# Patient Record
Sex: Female | Born: 1970
Health system: Southern US, Community
[De-identification: ages and names within clinical notes are randomized; demographics above are authoritative.]

## PROBLEM LIST (undated history)

## (undated) DIAGNOSIS — I10 Essential (primary) hypertension: Secondary | ICD-10-CM

## (undated) DIAGNOSIS — S86019A Strain of unspecified Achilles tendon, initial encounter: Secondary | ICD-10-CM

## (undated) DIAGNOSIS — J45909 Unspecified asthma, uncomplicated: Secondary | ICD-10-CM

## (undated) DIAGNOSIS — F32A Depression, unspecified: Secondary | ICD-10-CM

## (undated) DIAGNOSIS — T4145XA Adverse effect of unspecified anesthetic, initial encounter: Secondary | ICD-10-CM

## (undated) DIAGNOSIS — Z8614 Personal history of Methicillin resistant Staphylococcus aureus infection: Secondary | ICD-10-CM

## (undated) DIAGNOSIS — K219 Gastro-esophageal reflux disease without esophagitis: Secondary | ICD-10-CM

## (undated) DIAGNOSIS — F419 Anxiety disorder, unspecified: Secondary | ICD-10-CM

## (undated) DIAGNOSIS — Z9889 Other specified postprocedural states: Secondary | ICD-10-CM

## (undated) DIAGNOSIS — G43909 Migraine, unspecified, not intractable, without status migrainosus: Secondary | ICD-10-CM

## (undated) DIAGNOSIS — F329 Major depressive disorder, single episode, unspecified: Secondary | ICD-10-CM

## (undated) DIAGNOSIS — R112 Nausea with vomiting, unspecified: Secondary | ICD-10-CM

---

## 1999-05-21 DIAGNOSIS — T8859XA Other complications of anesthesia, initial encounter: Secondary | ICD-10-CM

## 1999-05-21 HISTORY — DX: Other complications of anesthesia, initial encounter: T88.59XA

## 1999-05-21 HISTORY — PX: CHOLECYSTECTOMY: SHX55

## 1999-05-29 ENCOUNTER — Encounter: Admission: RE | Admit: 1999-05-29 | Discharge: 1999-05-29 | Payer: Self-pay | Admitting: Family Medicine

## 1999-05-29 ENCOUNTER — Encounter: Payer: Self-pay | Admitting: Family Medicine

## 1999-06-26 ENCOUNTER — Encounter: Payer: Self-pay | Admitting: Surgery

## 1999-06-26 ENCOUNTER — Encounter (INDEPENDENT_AMBULATORY_CARE_PROVIDER_SITE_OTHER): Payer: Self-pay | Admitting: *Deleted

## 1999-06-28 ENCOUNTER — Inpatient Hospital Stay (HOSPITAL_COMMUNITY): Admission: RE | Admit: 1999-06-28 | Discharge: 1999-06-29 | Payer: Self-pay | Admitting: Surgery

## 1999-07-31 ENCOUNTER — Encounter (INDEPENDENT_AMBULATORY_CARE_PROVIDER_SITE_OTHER): Payer: Self-pay

## 1999-07-31 ENCOUNTER — Ambulatory Visit (HOSPITAL_COMMUNITY): Admission: AD | Admit: 1999-07-31 | Discharge: 1999-07-31 | Payer: Self-pay

## 2000-01-28 ENCOUNTER — Ambulatory Visit (HOSPITAL_COMMUNITY): Admission: RE | Admit: 2000-01-28 | Discharge: 2000-01-28 | Payer: Self-pay | Admitting: Obstetrics and Gynecology

## 2000-01-28 ENCOUNTER — Encounter (INDEPENDENT_AMBULATORY_CARE_PROVIDER_SITE_OTHER): Payer: Self-pay

## 2000-12-04 ENCOUNTER — Other Ambulatory Visit: Admission: RE | Admit: 2000-12-04 | Discharge: 2000-12-04 | Payer: Self-pay | Admitting: Obstetrics and Gynecology

## 2003-04-11 ENCOUNTER — Emergency Department (HOSPITAL_COMMUNITY): Admission: EM | Admit: 2003-04-11 | Discharge: 2003-04-11 | Payer: Self-pay | Admitting: Emergency Medicine

## 2003-10-27 ENCOUNTER — Emergency Department (HOSPITAL_COMMUNITY): Admission: EM | Admit: 2003-10-27 | Discharge: 2003-10-27 | Payer: Self-pay | Admitting: Emergency Medicine

## 2008-05-20 DIAGNOSIS — Z8614 Personal history of Methicillin resistant Staphylococcus aureus infection: Secondary | ICD-10-CM

## 2008-05-20 HISTORY — DX: Personal history of Methicillin resistant Staphylococcus aureus infection: Z86.14

## 2008-11-12 ENCOUNTER — Emergency Department (HOSPITAL_COMMUNITY): Admission: EM | Admit: 2008-11-12 | Discharge: 2008-11-12 | Payer: Self-pay | Admitting: Emergency Medicine

## 2008-12-26 ENCOUNTER — Emergency Department (HOSPITAL_COMMUNITY): Admission: EM | Admit: 2008-12-26 | Discharge: 2008-12-27 | Payer: Self-pay | Admitting: Emergency Medicine

## 2009-03-21 ENCOUNTER — Emergency Department (HOSPITAL_COMMUNITY): Admission: EM | Admit: 2009-03-21 | Discharge: 2009-03-22 | Payer: Self-pay | Admitting: Emergency Medicine

## 2009-03-22 ENCOUNTER — Emergency Department (HOSPITAL_COMMUNITY): Admission: EM | Admit: 2009-03-22 | Discharge: 2009-03-22 | Payer: Self-pay | Admitting: Emergency Medicine

## 2009-05-23 ENCOUNTER — Ambulatory Visit (HOSPITAL_COMMUNITY): Admission: RE | Admit: 2009-05-23 | Discharge: 2009-05-23 | Payer: Self-pay | Admitting: Family Medicine

## 2009-12-04 ENCOUNTER — Emergency Department (HOSPITAL_COMMUNITY): Admission: EM | Admit: 2009-12-04 | Discharge: 2009-12-04 | Payer: Self-pay | Admitting: Emergency Medicine

## 2010-08-22 LAB — BASIC METABOLIC PANEL
CO2: 27 mEq/L (ref 19–32)
Calcium: 9.1 mg/dL (ref 8.4–10.5)
Creatinine, Ser: 0.57 mg/dL (ref 0.4–1.2)
GFR calc Af Amer: >60 mL/min (ref >60)
Glucose, Bld: 111 mg/dL — ABNORMAL HIGH (ref 70–99)
Potassium: 3.6 mEq/L (ref 3.5–5.1)

## 2010-08-22 LAB — URINALYSIS, ROUTINE W REFLEX MICROSCOPIC
Bilirubin Urine: NEGATIVE
Glucose, UA: NEGATIVE mg/dL
Ketones, ur: NEGATIVE mg/dL
pH: 6 (ref 5.0–8.0)

## 2010-08-22 LAB — DIFFERENTIAL
Eosinophils Relative: 1 % (ref 0–5)
Lymphocytes Relative: 20 % (ref 12–46)
Monocytes Relative: 6 % (ref 3–12)
Neutro Abs: 9.4 10*3/uL — ABNORMAL HIGH (ref 1.7–7.7)
Neutrophils Relative %: 72 % (ref 43–77)

## 2010-08-22 LAB — WOUND CULTURE

## 2010-08-22 LAB — CBC
HCT: 36.7 % (ref 36.0–46.0)
Platelets: 319 10*3/uL (ref 150–400)

## 2010-08-22 LAB — URINE MICROSCOPIC-ADD ON

## 2010-08-25 LAB — URINE MICROSCOPIC-ADD ON

## 2010-08-25 LAB — URINALYSIS, ROUTINE W REFLEX MICROSCOPIC
Bilirubin Urine: NEGATIVE
Glucose, UA: NEGATIVE mg/dL
Leukocytes, UA: NEGATIVE
Nitrite: NEGATIVE
Urobilinogen, UA: 0.2 mg/dL (ref 0.0–1.0)

## 2010-10-05 NOTE — Op Note (Signed)
[place]  Patient:    Erin Vaughn                      MRN: [id number] Proc. Date: 06/26/99 Adm. Date:  [id number] Attending:  Abigail Miyamoto A                           Operative Report  PREOPERATIVE DIAGNOSIS:  Symptomatic cholelithiasis.  POSTOPERATIVE DIAGNOSIS:  Symptomatic cholelithiasis.  PROCEDURE: 1. Open cholecystectomy, conversion from laparoscopic cholecystectomy. 2. Intraoperative cholangiogram.  SURGEON:  Abigail Miyamoto, M.D.  ASSISTANT:  Donnie Coffin. Samuella Cota, M.D.  ANESTHESIA:  General endotracheal anesthesia.  INDICATIONS:  Ms. Erin Vaughn is a 40 year old female who presented with symptomatic cholelithiasis.  An ultrasound confirmed multiple gallstones with two measuring over 2.0 cm each.  She had a normal common bile duct on ultrasound, as well as normal liver function tests.  Therefore the decision was made to proceed with a cholecystectomy.  FINDINGS:  The patient was found to have two very large gallstones in her gallbladder, with one being lodged in the cystic stump, making the ability to do a laparoscopic cholecystectomy difficult, secondary to the inability to visualize the cystic stump well.  The decision was made to proceed to convert to an open procedure, at which time the stone was pushed back into the gallbladder, and the cystic stump was more easily identified.  DESCRIPTION OF PROCEDURE:  The patient was brought to the operating room and identified as Erin Vaughn.  She was placed supine on then operating room table and general anesthesia was induced.  Her abdomen was then prepped and draped in the usual sterile fashion.  Using a #15 blade, a small transverse incision was made  just above the umbilicus.  The incision was carried down to the fascia bluntly ith a hemostat.  The fascia was then opened with a scalpel.  A hemostat was then used to puncture the peritoneal cavity.  A #0 Vicryl  pursestring suture was then placed around the fascial opening.  The Hussan port was then placed through the opening and insufflation of the abdomen was done.  Next the _______ port was placed in he patients epigastrium and two 5.0 mm ports were placed in the patients right flank, all under direct vision.  The gallbladder was then grasped and retracted  above the liver bed.  Dissection of the gallbladder at the hilum was then begun. The gallbladder at the hilum was found to be quite dilated and scarred, and was  partly intrahepatic.  This made it quite difficult to visualize the cystic stump at all.  The cystic artery was identified, however, and clipped twice proximally, nd once distally, and cut with scissors.  The gallbladder was then dissected up each side along the peritoneum in order to better visualize it, along with the coming from a dome down technique.  This still made the cystic stump difficult to visualize, and it appeared quite distended in nature.  At this point the decision was made to proceed with the cholangiogram.  A hole was made in the distal aspect of the gallbladder, and a cholangiocatheter was inserted.  A cholangiogram was performed.  The gallbladder filled with contrast, but the large cystic stump did not allow any contrast to go further into the common bile duct.  At this point he decision was made to convert to an open procedure in order to avoid injury to the  common bile duct.  At this point all ports were removed, and the abdomen was deflated.  A right upper quadrant incision was then made, incorporating the epigastric incision with the #10 blade scalpel.  The incision was carried down through the  fascial layers as well as the rectus muscle with the electrocautery.  The peritoneum was then identified and opened with the scalpel.  The peritoneum was  then opened the rest of the way with the electrocautery.  The gallbladder was then identified  and clamped with the [facility] clamp.  The large stone in the cystic stump was then retracted back up into the gallbladder, making the cystic stump more easy to identify.  The peritoneum was then slowly taken down around the cystic stump. The cystic stump was then clamped with the right angle clamp in the gallbladder, and was transected, removing the gallbladder.  The stump was then tied off with two separate #2-0 silk ties.  Hemostasis appeared to be achieved.  The abdomen was hen irrigated with normal saline.  The fascia was then closed by first closing the midline in the posterior fascia with a running #2-0 PDS suture.  The anterior fascia was likewise closed with a running #0 PDS suture.  The skin incision was  then irrigated and closed with skin staples.  The #0 Vicryl at the umbilicus was then closed prior to this.  The patient tolerated the procedure well.  All sponge, needle, and instrument counts were correct at the end of the procedure.  The patient was then taken in  stable condition from the operating room to the recovery room.  DD:  06/26/99 TD:  06/26/99 Job: [id number]

## 2010-10-05 NOTE — Op Note (Signed)
[facility]  Patient:    Erin Vaughn, Erin Vaughn                     MRN: [id number] Proc. Date: 07/31/99 Adm. Date:  [id number] Attending:  Osborn Coho CC:         [facility] on [address]                           Operative Report  PREOPERATIVE DIAGNOSIS:       Incomplete abortion.  POSTOPERATIVE DIAGNOSIS:      Incomplete abortion.  OPERATION:                    Dilatation and curettage.  SURGEON:                      Mark E. Dareen Piano, M.D.  ASSISTANT:  ANESTHESIA:                   MAC with paracervical block.  DRAINS:                       None.  ANTIBIOTICS:                  Cefotan 1 gram.  SPECIMEN:                     Products of conception sent to pathology.  COMPLICATIONS:                None.  INDICATIONS:                  Ms. Ballard is a 40 year old white female who states that she underwent a termination of pregnancy on July 20, 1999, at [facility] on [address]  The patient states that on July 27, 1999, she began to  have heavy bleeding.  This increased.  On July 29, 1999, she began passing heavy clots and changing a pad every several hours.  She presented to the emergency room after the bleeding increased and she states that she had a possible syncopal episode on July 30, 1999.  The patient states that she tried to reach the clinic multiple times for advice, however, this was unsuccessful.  She tried to do this for two days.  DESCRIPTION OF PROCEDURE:     The patient was taken to the operating room where she was placed in the dorsal lithotomy position.  She was prepped with Hibiclens and draped with green towels.  MAC anesthesia was administered.  Sterile speculum was placed in the vagina.  20 cc of 1% lidocaine was used for a paracervical block.  Single tooth tenaculum was applied to the anterior cervical lip.  The cervical s was then dilated to a 59 [place].  The 9 mm suction  cannula was placed in the uterine cavity and products of conception were withdrawn.  Sharp curettage was hen performed followed by repeat suction.  The patient tolerated the procedure well. She was taken to the recovery room in stable condition.  She will be discharged to home with Keflex 500 mg q.i.d. for two days.  She will also be given Anaprox Double Strength p.r.n. for pain.  She will follow up in one month. DD:  07/31/99 TD:  07/31/99 Job: [id number]

## 2010-10-05 NOTE — Op Note (Signed)
[facility]  Patient:    Erin Vaughn, Erin Vaughn                     MRN: [id number] Proc. Date: 01/28/00 Adm. Date:  [id number] Attending:  Osborn Coho                           Operative Report  PREOPERATIVE DIAGNOSES:       1. Intrauterine pregnancy at nine weeks                                  estimated gestational age.                               2. Patient desires termination of pregnancy.  POSTOPERATIVE DIAGNOSES:      1. Intrauterine pregnancy at nine weeks                                  estimated gestational age.                               2. Patient desires termination of pregnancy.  OPERATION/PROCEDURE:          Dilatation and curettage.  SURGEON:                      Mark E. Dareen Piano, M.D.  ANESTHESIA:                   MAC with paracervical block.  ANTIBIOTICS:                  Ancef 1 g.  ESTIMATED BLOOD LOSS:         Estimated blood loss was 25 cc.  COMPLICATIONS:                None.  SPECIMENS:                    Products of conception sent to pathology.  DESCRIPTION OF PROCEDURE:     The patient was taken to the operating room and placed in the dorsal lithotomy position.  She was prepped with Hibiclens and draped in the usual sterile fashion for this procedure and the bladder was drained with a red rubber catheter.  MAC anesthesia was administered.  A sterile speculum was placed in the vagina and 20 cc of lidocaine used for paracervical block.  The cervix was grasped anteriorly with a single-tooth tenaculum and the cervix was serially dilated to a 31 [place].  A 9 mm suction cannula was placed into the uterine cavity and products of conception withdrawn.  Sharp curettage was then performed, followed by repeat suction. The patient tolerated the procedure well and will be taken to the recovery room in stable condition.  She will be discharged home with Keflex 500 mg q.i.d. for two days, methergine 0.2 mg q.i.d. for  two days, and Darvocet p.r.n.   She will follow up in the office in one month. DD:  01/28/00 TD:  01/29/00 Job: [id number]

## 2010-10-14 ENCOUNTER — Emergency Department (HOSPITAL_COMMUNITY): Payer: Self-pay

## 2010-10-14 ENCOUNTER — Emergency Department (HOSPITAL_COMMUNITY)
Admission: EM | Admit: 2010-10-14 | Discharge: 2010-10-14 | Disposition: A | Discharge status: Home / Self Care | Payer: Self-pay | Attending: Emergency Medicine | Admitting: Emergency Medicine

## 2010-10-14 DIAGNOSIS — J45909 Unspecified asthma, uncomplicated: Secondary | ICD-10-CM | POA: Insufficient documentation

## 2010-10-14 DIAGNOSIS — F17200 Nicotine dependence, unspecified, uncomplicated: Secondary | ICD-10-CM | POA: Insufficient documentation

## 2010-10-14 DIAGNOSIS — R0602 Shortness of breath: Secondary | ICD-10-CM | POA: Insufficient documentation

## 2010-10-14 LAB — BLOOD GAS, ARTERIAL
FIO2: 21 %
pCO2 arterial: 33 mmHg — ABNORMAL LOW (ref 35.0–45.0)
pO2, Arterial: 72.4 mmHg — ABNORMAL LOW (ref 80.0–100.0)

## 2010-10-14 LAB — DIFFERENTIAL
Eosinophils Relative: 4 % (ref 0–5)
Lymphocytes Relative: 12 % (ref 12–46)
Monocytes Absolute: 0.9 10*3/uL (ref 0.1–1.0)
Neutro Abs: 9.6 10*3/uL — ABNORMAL HIGH (ref 1.7–7.7)
Neutrophils Relative %: 77 % (ref 43–77)

## 2010-10-14 LAB — CBC
Hemoglobin: 12.7 g/dL (ref 12.0–15.0)
MCHC: 33.2 g/dL (ref 30.0–36.0)
Platelets: 266 10*3/uL (ref 150–400)
WBC: 12.4 10*3/uL — ABNORMAL HIGH (ref 4.0–10.5)

## 2010-10-14 LAB — URINALYSIS, ROUTINE W REFLEX MICROSCOPIC
Ketones, ur: NEGATIVE mg/dL
Protein, ur: NEGATIVE mg/dL
Urobilinogen, UA: 0.2 mg/dL (ref 0.0–1.0)

## 2010-10-14 LAB — COMPREHENSIVE METABOLIC PANEL
AST: 14 U/L (ref 0–37)
Alkaline Phosphatase: 97 U/L (ref 39–117)
BUN: 6 mg/dL (ref 6–23)
CO2: 25 mEq/L (ref 19–32)
Chloride: 101 mEq/L (ref 96–112)
Creatinine, Ser: 0.47 mg/dL (ref 0.4–1.2)
GFR calc non Af Amer: >60 mL/min (ref >60)
Potassium: 3.4 mEq/L — ABNORMAL LOW (ref 3.5–5.1)
Total Bilirubin: 0.4 mg/dL (ref 0.3–1.2)

## 2010-10-14 LAB — URINE MICROSCOPIC-ADD ON

## 2010-10-15 LAB — URINE CULTURE: Culture  Setup Time: 201205272013

## 2011-07-03 ENCOUNTER — Other Ambulatory Visit (HOSPITAL_COMMUNITY): Payer: Self-pay | Admitting: Family Medicine

## 2011-07-03 DIAGNOSIS — Z1231 Encounter for screening mammogram for malignant neoplasm of breast: Secondary | ICD-10-CM

## 2011-07-09 ENCOUNTER — Ambulatory Visit (HOSPITAL_COMMUNITY)
Admission: RE | Admit: 2011-07-09 | Discharge: 2011-07-09 | Disposition: A | Discharge status: Home / Self Care | Payer: Self-pay | Source: Ambulatory Visit | Attending: Family Medicine | Admitting: Family Medicine

## 2011-07-09 DIAGNOSIS — Z1231 Encounter for screening mammogram for malignant neoplasm of breast: Secondary | ICD-10-CM

## 2012-01-27 ENCOUNTER — Encounter (HOSPITAL_COMMUNITY): Payer: Self-pay | Admitting: Emergency Medicine

## 2012-01-27 ENCOUNTER — Emergency Department (HOSPITAL_COMMUNITY)
Admission: EM | Admit: 2012-01-27 | Discharge: 2012-01-27 | Disposition: A | Discharge status: Home / Self Care | Payer: Self-pay | Attending: Emergency Medicine | Admitting: Emergency Medicine

## 2012-01-27 DIAGNOSIS — Z881 Allergy status to other antibiotic agents status: Secondary | ICD-10-CM | POA: Insufficient documentation

## 2012-01-27 DIAGNOSIS — Z888 Allergy status to other drugs, medicaments and biological substances status: Secondary | ICD-10-CM | POA: Insufficient documentation

## 2012-01-27 DIAGNOSIS — L02419 Cutaneous abscess of limb, unspecified: Secondary | ICD-10-CM | POA: Insufficient documentation

## 2012-01-27 DIAGNOSIS — Z7982 Long term (current) use of aspirin: Secondary | ICD-10-CM | POA: Insufficient documentation

## 2012-01-27 DIAGNOSIS — L0390 Cellulitis, unspecified: Secondary | ICD-10-CM

## 2012-01-27 DIAGNOSIS — F17200 Nicotine dependence, unspecified, uncomplicated: Secondary | ICD-10-CM | POA: Insufficient documentation

## 2012-01-27 MED ORDER — CEPHALEXIN 500 MG PO CAPS
500.0000 mg | ORAL_CAPSULE | Freq: Once | ORAL | Status: AC
  Administered 2012-01-27: 500 mg via ORAL
  Filled 2012-01-27: qty 1

## 2012-01-27 MED ORDER — HYDROCODONE-ACETAMINOPHEN 5-325 MG PO TABS: 1.0000 | ORAL_TABLET | Freq: Four times a day (QID) | ORAL | Status: AC | PRN

## 2012-01-27 MED ORDER — SULFAMETHOXAZOLE-TRIMETHOPRIM 800-160 MG PO TABS: 1.0000 | ORAL_TABLET | Freq: Two times a day (BID) | ORAL | Status: AC

## 2012-01-27 MED ORDER — CEPHALEXIN 500 MG PO CAPS: 500.0000 mg | ORAL_CAPSULE | Freq: Four times a day (QID) | ORAL | Status: AC

## 2012-01-27 MED ORDER — SULFAMETHOXAZOLE-TMP DS 800-160 MG PO TABS
1.0000 | ORAL_TABLET | Freq: Once | ORAL | Status: AC
  Administered 2012-01-27: 1 via ORAL
  Filled 2012-01-27: qty 1

## 2012-01-27 MED ORDER — ONDANSETRON 4 MG PO TBDP: 4.0000 mg | ORAL_TABLET | Freq: Three times a day (TID) | ORAL | Status: AC | PRN

## 2012-01-27 NOTE — ED Provider Notes (Signed)
History     CSN: [id number]  Arrival date & time 01/27/12  4403   First MD Initiated Contact with Patient 01/27/12 1000      Chief Complaint  Patient presents with  . Cellulitis    (Consider location/radiation/quality/duration/timing/severity/associated sxs/prior treatment) HPI Comments: Pt notes redness and swelling to L anterolateral thigh worsening over the past 3 weeks.  A new area has emerged on the L thigh in the past few days.  The went to the health dept today and was sent to the ED.  She denies fever.  Vomited once this AM.  The history is provided by the patient. No language interpreter was used.    History reviewed. No pertinent past medical history.  Past Surgical History  Procedure Date  . Cholecystectomy     History reviewed. No pertinent family history.  History  Substance Use Topics  . Smoking status: Current Everyday Smoker  . Smokeless tobacco: Not on file  . Alcohol Use: No    OB History    Grav Para Term Preterm Abortions TAB SAB Ect Mult Living                  Review of Systems  Constitutional: Negative for fever and chills.  Gastrointestinal: Positive for nausea and vomiting.  Skin:       Redness to thigh  All other systems reviewed and are negative.    Allergies  Vancomycin and Zithromax  Home Medications   Current Outpatient Rx  Name Route Sig Dispense Refill  . ASPIRIN 325 MG PO TABS Oral Take 325 mg by mouth daily.    Marland Kitchen BAYER BACK & BODY PAIN EX ST PO Oral Take 1 tablet by mouth daily as needed. For pain    . IBUPROFEN 200 MG PO TABS Oral Take 200 mg by mouth every 6 (six) hours as needed. For pain    . NAPROXEN SODIUM 220 MG PO TABS Oral Take 440 mg by mouth daily as needed. For pain    . NORETHINDRONE 0.35 MG PO TABS Oral Take 1 tablet by mouth daily.    . CEPHALEXIN 500 MG PO CAPS Oral Take 1 capsule (500 mg total) by mouth 4 (four) times daily. 40 capsule 0  . HYDROCODONE-ACETAMINOPHEN 5-325 MG PO TABS Oral Take 1 tablet by  mouth every 6 (six) hours as needed for pain. 20 tablet 0  . ONDANSETRON 4 MG PO TBDP Oral Take 1 tablet (4 mg total) by mouth every 8 (eight) hours as needed for nausea. 10 tablet 0  . SULFAMETHOXAZOLE-TRIMETHOPRIM 800-160 MG PO TABS Oral Take 1 tablet by mouth every 12 (twelve) hours. 20 tablet 0    BP 148/80  Pulse 86  Temp 98.4 F (36.9 C) (Oral)  Resp 17  Ht 5' 3.75" (1.619 m)  Wt 274 lb (124.286 kg)  BMI 47.40 kg/m2  SpO2 99%  LMP 01/27/2012  Physical Exam  Nursing note and vitals reviewed. Constitutional: She is oriented to person, place, and time. She appears well-developed and well-nourished. No distress.  HENT:  Head: Normocephalic and atraumatic.  Eyes: EOM are normal.  Neck: Normal range of motion.  Cardiovascular: Normal rate, regular rhythm and normal heart sounds.   Pulmonary/Chest: Effort normal and breath sounds normal.  Abdominal: Soft. She exhibits no distension. There is no tenderness.  Musculoskeletal: Normal range of motion.       Legs: Neurological: She is alert and oriented to person, place, and time.  Skin: Skin is warm and dry.  Psychiatric: She has a normal mood and affect. Judgment normal.    ED Course  Procedures (including critical care time)  Labs Reviewed - No data to display No results found.   1. Cellulitis       MDM  rx-keflex rx-bactrim DS rx-hydrocodone rx-zofran F/u [facility] in 1 week.        Evalina Field, [place] 01/27/12 1136

## 2012-01-27 NOTE — ED Notes (Signed)
Pt c/o redness started to L knee/upper leg x 2 weeks ago. Now on r leg. Seen at [facility] today and told to come here due to cellulites. Anterior r and l leg red, hot to touch to the red areas.

## 2012-01-29 ENCOUNTER — Encounter (HOSPITAL_COMMUNITY): Payer: Self-pay | Admitting: Emergency Medicine

## 2012-01-29 ENCOUNTER — Emergency Department (HOSPITAL_COMMUNITY)
Admission: EM | Admit: 2012-01-29 | Discharge: 2012-01-29 | Disposition: A | Discharge status: Home / Self Care | Payer: Self-pay | Attending: Emergency Medicine | Admitting: Emergency Medicine

## 2012-01-29 DIAGNOSIS — F17200 Nicotine dependence, unspecified, uncomplicated: Secondary | ICD-10-CM | POA: Insufficient documentation

## 2012-01-29 DIAGNOSIS — L02419 Cutaneous abscess of limb, unspecified: Secondary | ICD-10-CM | POA: Insufficient documentation

## 2012-01-29 DIAGNOSIS — L03119 Cellulitis of unspecified part of limb: Secondary | ICD-10-CM | POA: Insufficient documentation

## 2012-01-29 DIAGNOSIS — L0390 Cellulitis, unspecified: Secondary | ICD-10-CM

## 2012-01-29 NOTE — ED Provider Notes (Signed)
History     CSN: [id number]  Arrival date & time 01/29/12  0549   First MD Initiated Contact with Patient 01/29/12 [phone]      Chief Complaint  Patient presents with  . Cellulitis    (Consider location/radiation/quality/duration/timing/severity/associated sxs/prior treatment) HPI HX per PT< evaluated here 2 days ago for cellulitis, and was started on ABx. At that time having sig pain with N/V, no Fevers. Today still some redness and maybe moving from area of R thigh. Pain much improved, no further N/V. Now is mild in severity, followed by [facility]. No known inciting factors, has h/o same after bug bites and minor skin trauma.  History reviewed. No pertinent past medical history.  Past Surgical History  Procedure Date  . Cholecystectomy     History reviewed. No pertinent family history.  History  Substance Use Topics  . Smoking status: Current Every Day Smoker  . Smokeless tobacco: Not on file  . Alcohol Use: No    OB History    Grav Para Term Preterm Abortions TAB SAB Ect Mult Living                  Review of Systems  Constitutional: Negative for fever and chills.  HENT: Negative for neck pain and neck stiffness.   Eyes: Negative for pain.  Respiratory: Negative for shortness of breath.   Cardiovascular: Negative for chest pain.  Gastrointestinal: Negative for abdominal pain.  Genitourinary: Negative for dysuria.  Musculoskeletal: Negative for back pain.  Skin: Positive for rash.  Neurological: Negative for headaches.  All other systems reviewed and are negative.    Allergies  Vancomycin and Zithromax  Home Medications   Current Outpatient Rx  Name Route Sig Dispense Refill  . ASPIRIN 325 MG PO TABS Oral Take 325 mg by mouth daily.    Marland Kitchen BAYER BACK & BODY PAIN EX ST PO Oral Take 1 tablet by mouth daily as needed. For pain    . CEPHALEXIN 500 MG PO CAPS Oral Take 1 capsule (500 mg total) by mouth 4 (four) times daily. 40 capsule 0  .  HYDROCODONE-ACETAMINOPHEN 5-325 MG PO TABS Oral Take 1 tablet by mouth every 6 (six) hours as needed for pain. 20 tablet 0  . IBUPROFEN 200 MG PO TABS Oral Take 200 mg by mouth every 6 (six) hours as needed. For pain    . NAPROXEN SODIUM 220 MG PO TABS Oral Take 440 mg by mouth daily as needed. For pain    . NORETHINDRONE 0.35 MG PO TABS Oral Take 1 tablet by mouth daily.    Marland Kitchen ONDANSETRON 4 MG PO TBDP Oral Take 1 tablet (4 mg total) by mouth every 8 (eight) hours as needed for nausea. 10 tablet 0  . SULFAMETHOXAZOLE-TRIMETHOPRIM 800-160 MG PO TABS Oral Take 1 tablet by mouth every 12 (twelve) hours. 20 tablet 0    BP 139/81  Pulse 84  Temp 97.8 F (36.6 C) (Oral)  Resp 24  SpO2 100%  LMP 01/27/2012  Physical Exam  Constitutional: She is oriented to person, place, and time. She appears well-developed and well-nourished.  HENT:  Head: Normocephalic and atraumatic.  Eyes: Conjunctivae normal and EOM are normal. Pupils are equal, round, and reactive to light.  Neck: Full passive range of motion without pain. Neck supple. No thyromegaly present.  Cardiovascular: Normal rate, regular rhythm, S1 normal, S2 normal and intact distal pulses.   Pulmonary/Chest: Effort normal and breath sounds normal.  Abdominal: Soft. Bowel sounds are  normal. There is no tenderness. There is no CVA tenderness.  Musculoskeletal: Normal range of motion.       R anterior thigh with very mild area of erythema, nontender, min swelling. Distal N/V intcat x 4. L anterior thigh ink outline still present no sig cellulitis  Neurological: She is alert and oriented to person, place, and time. She has normal strength and normal reflexes. No cranial nerve deficit or sensory deficit. She displays a negative Romberg sign. GCS eye subscore is 4. GCS verbal subscore is 5. GCS motor subscore is 6.       Normal Gait  Skin: Skin is warm and dry. No cyanosis. Nails show no clubbing.  Psychiatric: She has a normal mood and affect. Her  speech is normal and behavior is normal.    ED Course  Procedures (including critical care time)  Cellulitis recheck - improving  Plan cont ABx and recheck 48   Strict return precautions verbalized as understood.    MDM   Old records, previous notes, VS and Nursing notes all reviewed.         Sunnie Nielsen, MD 01/29/12 [phone]

## 2012-01-29 NOTE — ED Notes (Signed)
Pt ambulatory leaving. Pt left with discharge instructions and verbalized understanding of instructions. Pt had no questions.

## 2012-01-29 NOTE — ED Notes (Signed)
Pt denies nausea and vomiting but states she was nauseous and vomiting Monday evening. Pt denies SOB, dizziness, and weakness.

## 2012-01-29 NOTE — ED Notes (Signed)
Pt states was diagnosed with cellulitis on Monday and was given marks on her leg and was told to return if swelling spread outside the marked area. Pt states it is on both legs but only outside the marks on the right leg.

## 2012-01-31 NOTE — ED Provider Notes (Signed)
Medical screening examination/treatment/procedure(s) were performed by non-physician practitioner and as supervising physician I was immediately available for consultation/collaboration.  Donnetta Hutching, MD 01/31/12 [phone]

## 2012-06-12 ENCOUNTER — Other Ambulatory Visit (HOSPITAL_COMMUNITY): Payer: Self-pay | Admitting: Nurse Practitioner

## 2012-06-12 DIAGNOSIS — Z139 Encounter for screening, unspecified: Secondary | ICD-10-CM

## 2012-06-26 ENCOUNTER — Other Ambulatory Visit (HOSPITAL_COMMUNITY): Payer: Self-pay | Admitting: *Deleted

## 2012-06-26 ENCOUNTER — Other Ambulatory Visit (HOSPITAL_COMMUNITY): Payer: Self-pay | Admitting: Nurse Practitioner

## 2012-06-26 DIAGNOSIS — Z975 Presence of (intrauterine) contraceptive device: Secondary | ICD-10-CM

## 2012-07-02 ENCOUNTER — Ambulatory Visit (HOSPITAL_COMMUNITY)
Admission: RE | Admit: 2012-07-02 | Payer: Self-pay | Source: Ambulatory Visit | Attending: *Deleted | Admitting: *Deleted

## 2012-07-02 ENCOUNTER — Other Ambulatory Visit (HOSPITAL_COMMUNITY): Payer: Self-pay

## 2012-07-02 ENCOUNTER — Ambulatory Visit (HOSPITAL_COMMUNITY)
Admission: RE | Admit: 2012-07-02 | Discharge: 2012-07-02 | Disposition: A | Discharge status: Home / Self Care | Payer: Self-pay | Source: Ambulatory Visit | Attending: *Deleted | Admitting: *Deleted

## 2012-07-02 DIAGNOSIS — D259 Leiomyoma of uterus, unspecified: Secondary | ICD-10-CM | POA: Insufficient documentation

## 2012-07-02 DIAGNOSIS — Z975 Presence of (intrauterine) contraceptive device: Secondary | ICD-10-CM

## 2012-07-02 DIAGNOSIS — N938 Other specified abnormal uterine and vaginal bleeding: Secondary | ICD-10-CM | POA: Insufficient documentation

## 2012-07-02 DIAGNOSIS — N949 Unspecified condition associated with female genital organs and menstrual cycle: Secondary | ICD-10-CM | POA: Insufficient documentation

## 2012-07-02 DIAGNOSIS — N83209 Unspecified ovarian cyst, unspecified side: Secondary | ICD-10-CM | POA: Insufficient documentation

## 2012-07-09 ENCOUNTER — Ambulatory Visit (HOSPITAL_COMMUNITY)
Admission: RE | Admit: 2012-07-09 | Discharge: 2012-07-09 | Disposition: A | Discharge status: Home / Self Care | Payer: Self-pay | Source: Ambulatory Visit | Attending: Nurse Practitioner | Admitting: Nurse Practitioner

## 2012-07-09 DIAGNOSIS — Z139 Encounter for screening, unspecified: Secondary | ICD-10-CM

## 2013-05-01 ENCOUNTER — Emergency Department (HOSPITAL_COMMUNITY)
Admission: EM | Admit: 2013-05-01 | Discharge: 2013-05-01 | Disposition: A | Discharge status: Home / Self Care | Payer: BC Managed Care – PPO | Attending: Emergency Medicine | Admitting: Emergency Medicine

## 2013-05-01 ENCOUNTER — Encounter (HOSPITAL_COMMUNITY): Payer: Self-pay | Admitting: Emergency Medicine

## 2013-05-01 ENCOUNTER — Emergency Department (HOSPITAL_COMMUNITY): Payer: BC Managed Care – PPO

## 2013-05-01 DIAGNOSIS — R296 Repeated falls: Secondary | ICD-10-CM | POA: Insufficient documentation

## 2013-05-01 DIAGNOSIS — Y9389 Activity, other specified: Secondary | ICD-10-CM | POA: Insufficient documentation

## 2013-05-01 DIAGNOSIS — S93499A Sprain of other ligament of unspecified ankle, initial encounter: Secondary | ICD-10-CM | POA: Insufficient documentation

## 2013-05-01 DIAGNOSIS — F411 Generalized anxiety disorder: Secondary | ICD-10-CM | POA: Insufficient documentation

## 2013-05-01 DIAGNOSIS — Z87891 Personal history of nicotine dependence: Secondary | ICD-10-CM | POA: Insufficient documentation

## 2013-05-01 DIAGNOSIS — Z7982 Long term (current) use of aspirin: Secondary | ICD-10-CM | POA: Insufficient documentation

## 2013-05-01 DIAGNOSIS — Z79899 Other long term (current) drug therapy: Secondary | ICD-10-CM | POA: Insufficient documentation

## 2013-05-01 DIAGNOSIS — S86011A Strain of right Achilles tendon, initial encounter: Secondary | ICD-10-CM

## 2013-05-01 DIAGNOSIS — Z791 Long term (current) use of non-steroidal anti-inflammatories (NSAID): Secondary | ICD-10-CM | POA: Insufficient documentation

## 2013-05-01 DIAGNOSIS — X500XXA Overexertion from strenuous movement or load, initial encounter: Secondary | ICD-10-CM | POA: Insufficient documentation

## 2013-05-01 DIAGNOSIS — Y92009 Unspecified place in unspecified non-institutional (private) residence as the place of occurrence of the external cause: Secondary | ICD-10-CM | POA: Insufficient documentation

## 2013-05-01 HISTORY — DX: Anxiety disorder, unspecified: F41.9

## 2013-05-01 MED ORDER — IBUPROFEN 800 MG PO TABS
800.0000 mg | ORAL_TABLET | Freq: Once | ORAL | Status: AC
  Administered 2013-05-01: 800 mg via ORAL
  Filled 2013-05-01: qty 1

## 2013-05-01 MED ORDER — ASPIRIN 81 MG PO CHEW: 81.0000 mg | CHEWABLE_TABLET | Freq: Every day | ORAL | Status: DC

## 2013-05-01 MED ORDER — IBUPROFEN 800 MG PO TABS: 800.0000 mg | ORAL_TABLET | Freq: Three times a day (TID) | ORAL | Status: DC

## 2013-05-01 MED ORDER — HYDROCODONE-ACETAMINOPHEN 5-325 MG PO TABS: 2.0000 | ORAL_TABLET | ORAL | Status: DC | PRN

## 2013-05-01 NOTE — ED Notes (Signed)
Splint applied at this time to rt leg. Erin Vaughn

## 2013-05-01 NOTE — ED Notes (Signed)
Pt alert & oriented x4, stable gait. Patient given discharge instructions, paperwork & prescription(s). Patient  instructed to stop at the registration desk to finish any additional paperwork. Patient verbalized understanding. Pt left department w/ no further questions. 

## 2013-05-01 NOTE — ED Notes (Signed)
Pedal and dorsal pulses detected with doppler

## 2013-05-01 NOTE — ED Notes (Signed)
Pt co pain to rt leg, pulses palpated.

## 2013-05-01 NOTE — ED Notes (Signed)
Patient c/o right leg pain. Per patient injured achilles tendon 4 months ago and was giving exercise to do with the leg. Patient reports she chased dog today and felt a pop and fell on her right side. Patient states "foot got cold, then hot, and now feels like a drawing in calf." Per patient can not bear any weight on right leg.

## 2013-05-01 NOTE — ED Provider Notes (Signed)
CSN: [id number]     Arrival date & time 05/01/13  1454 History   This chart was scribed for Glynn Octave, MD by Luisa Dago, ED Scribe. This patient was seen in room APA09/APA09 and the patient's care was started at 3:19 PM.    Chief Complaint  Patient presents with  . Leg Pain    The history is provided by the patient. No language interpreter was used.   HPI Comments: Erin Vaughn is a 42 y.o. female who presents to the Emergency Department complaining of sudden onset of worsening of ongoing right leg pain. She originally injured her achillis tendon 4 months ago while she was pushing a cart and she felt the skin on her heel rip. She followed up with her PMD and was then referred to an Orthopedist  who gave her tendon stretches to do at home. She states that her pain had been improving until today when she was chasing her dog through the living room. She states that on her third step she felt a "pop" with immediate burning pain. She then fell to the right on the ground, she denies any head trauma or LOC. She states that since then she has experienced severe posterior lower calf pain described as pulling pain with inability to bear weight. She denies any involvement with her right knee. Pt denies taking any pain medication to relieve the pain.   Past Medical History  Diagnosis Date  . Anxiety    Past Surgical History  Procedure Laterality Date  . Cholecystectomy     Family History  Problem Relation Age of Onset  . Cancer Mother   . COPD Other   . Hypertension Other   . Asthma Other    History  Substance Use Topics  . Smoking status: Former Smoker -- 0.50 packs/day for 15 years    Types: Cigarettes    Quit date: 03/26/2013  . Smokeless tobacco: Never Used  . Alcohol Use: No   OB History   Grav Para Term Preterm Abortions TAB SAB Ect Mult Living   4 1 1  3  3   1      Review of Systems  Musculoskeletal: Positive for myalgias (localized to the right calf).  All other  systems reviewed and are negative.    Allergies  Vancomycin and Zithromax  Home Medications   Current Outpatient Rx  Name  Route  Sig  Dispense  Refill  . buPROPion (WELLBUTRIN SR) 200 MG 12 hr tablet   Oral   Take 200 mg by mouth 2 (two) times daily.         . montelukast (SINGULAIR) 10 MG tablet   Oral   Take 10 mg by mouth at bedtime.         . Multiple Vitamin (MULTIVITAMIN WITH MINERALS) TABS tablet   Oral   Take 1 tablet by mouth daily.         . naproxen sodium (ALEVE) 220 MG tablet   Oral   Take 440 mg by mouth daily.         . norethindrone (MICRONOR,CAMILA,ERRIN) 0.35 MG tablet   Oral   Take 1 tablet by mouth daily.         . Vitamin D, Ergocalciferol, (DRISDOL) 50000 UNITS CAPS capsule   Oral   Take 50,000 Units by mouth every Friday.         Marland Kitchen aspirin 81 MG chewable tablet   Oral   Chew 1 tablet (81 mg total) by  mouth daily.   30 tablet   0   . HYDROcodone-acetaminophen (NORCO/VICODIN) 5-325 MG per tablet   Oral   Take 2 tablets by mouth every 4 (four) hours as needed.   10 tablet   0   . ibuprofen (ADVIL,MOTRIN) 800 MG tablet   Oral   Take 1 tablet (800 mg total) by mouth 3 (three) times daily.   21 tablet   0    Triage Vitals:BP 152/70  Pulse 87  Temp(Src) 97.6 F (36.4 C) (Oral)  Resp 18  Ht 5' 3.75" (1.619 m)  Wt 276 lb 1 oz (125.221 kg)  BMI 47.77 kg/m2  SpO2 97%  LMP 05/01/2013  Physical Exam  Nursing note and vitals reviewed. Constitutional: She appears well-developed and well-nourished. No distress.  HENT:  Head: Normocephalic and atraumatic.  Eyes: Conjunctivae are normal. Right eye exhibits no discharge. Left eye exhibits no discharge.  Neck: Neck supple.  Cardiovascular: Normal rate, regular rhythm and normal heart sounds.  Exam reveals no gallop and no friction rub.   No murmur heard. Pulmonary/Chest: Effort normal and breath sounds normal. No respiratory distress.  Abdominal: Soft. She exhibits no  distension. There is no tenderness.  Musculoskeletal: She exhibits tenderness.  Tender to right posterior ankle along achillis tendon. Weak ankle flexion. Extension intact. +2 DP and PT  Positive Thompson's test. No proximal fibular tenderness.  Neurological: She is alert.  Skin: Skin is warm and dry.  Psychiatric: She has a normal mood and affect. Her behavior is normal. Thought content normal.    ED Course  [place] bedside Date/Time: 05/01/2013 3:47 PM Performed by: Glynn Octave Authorized by: Glynn Octave Consent: Verbal consent obtained. Risks and benefits: risks, benefits and alternatives were discussed Consent given by: patient Patient identity confirmed: verbally with patient Local anesthesia used: no Patient sedated: no Patient tolerance: Patient tolerated the procedure well with no immediate complications.   (including critical care time)  DIAGNOSTIC STUDIES: Oxygen Saturation is 97% on room air, adequate by my interpretation.    COORDINATION OF CARE: 3:21 PM-Discussed treatment plan which includes motrin, Leg X-ray with pt at bedside and pt agreed to plan. Pt declined any narcotic pain medication.  Labs Review Labs Reviewed - No data to display Imaging Review Dg Ankle Complete Right  05/01/2013   CLINICAL DATA:  Fall.  Ankle injury.  Ankle pain and swelling.  EXAM: RIGHT ANKLE - COMPLETE 3+ VIEW  COMPARISON:  None.  FINDINGS: No evidence of fracture or dislocation. Mild degenerative spurring is seen involving the tibiotalar joint. No evidence of ankle joint effusion. No other bone lesion identified. Plantar and dorsal calcaneal spurs incidentally noted.  IMPRESSION: No acute findings.  Ankle joint osteoarthritis.   Electronically Signed   By: Myles Rosenthal M.D.   On: 05/01/2013 15:54    EKG Interpretation   None       MDM   1. Achilles tendon rupture, right, initial encounter    Right posterior leg pain after running after her dog, felt a pop and immediate  pain. Weakness with right ankle plantar flexion. Intact pulses. No calf asymmetry.  Concern for Achilles tendon rupture.  US shows disruption of achilles tendon on R. patient refuses pain medication other than Motrin.  Discussed with Dr. Shelle Iron on call for orthopedics. He agrees with posterior splint, nonweightbearing, crutches. Recommends patient start baby aspirin a day. Patient to call the office on Monday.  I personally performed the services described in this documentation, which was scribed in my  presence. The recorded information has been reviewed and is accurate.     Glynn Octave, MD 05/01/13 [phone]

## 2013-05-04 ENCOUNTER — Other Ambulatory Visit: Payer: Self-pay | Admitting: Orthopedic Surgery

## 2013-05-04 NOTE — H&P (Signed)
Erin Vaughn is an 42 y.o. female.   Chief Complaint: right posterior ankle pain  HPI: The patient is a 42 year old female who presents with ankle complaints. The patient reports right ankle symptoms which began 2 day(s) ago following a specific injury. The injury occurred while the patient was at home. The activity involved running (chasing her dog, felt a sudden pop in her right ankle/calf) Symptoms are reported to be located in the right posterior ankle and include ankle pain, swelling, ecchymosis, decreased range of motion, difficulty bearing weight, difficulty ambulating and audible pop at the time of injury. The patient reports that symptoms radiate to the right lower leg (calf). The patient does describe their pain as sharp. Onset of symptoms was beginning immediately after the injury. The patient describes their symptoms as severe and does feel that they are unchanged. Symptoms are exacerbated by weight bearing. Symptoms are relieved by rest. Prior to being seen today the patient was previously evaluated in the emergency room. Symptoms present at the patient's previous evaluation included ankle pain, swelling, warmth, redness, an audible pop at the time of injury and difficulty bearing weight. Previous work-up for this problem has included ankle x-rays. Past treatment for this problem has included posterior splint and crutches (partial weight bearing). Note for "Ankle pain": Erin Vaughn presents today with her son. Reports R posterior ankle pain (achilles region) x 2 days, which started suddenly when chasing her dog at home, planted her foot while running and felt a sudden pop and pain, fell onto her couch on her right side. Denies other injuries. She does report within the last few months hitting her achilles region against a shopping cart twice, for which she saw her PCP and was working on some ROM exercises. She was still able to ambulate normally and push off her toes. Since her injury 2 days ago she  cannot push off her toes. She is using crutches and partially weightbearing. She is taking Ibuprofen 800mg  TID with food with relief. She was also given Rx for Norco but has only taken it once, which did help with pain. 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 She does report prior hx of MRSA in 2010. Previously received Vanco with Red Man syndrome rxn as well as diffuse itching. Denies Hx DVT.   Past Medical History  Diagnosis Date  . Anxiety     Past Surgical History  Procedure Laterality Date  . Cholecystectomy      Family History  Problem Relation Age of Onset  . Cancer Mother   . COPD Other   . Hypertension Other   . Asthma Other    Social History:  reports that she quit smoking about 5 weeks ago. Her smoking use included Cigarettes. She has a 7.5 pack-year smoking history. She has never used smokeless tobacco. She reports that she does not drink alcohol or use illicit drugs.  Allergies:  Allergies  Allergen Reactions  . Vancomycin     Burning and itching. Also makes body red  . Zithromax [Azithromycin] Swelling     (Not in a hospital admission)  No results found for this or any previous visit (from the past 48 hour(s)). No results found.  Review of Systems  Constitutional: Negative.   HENT: Negative.   Eyes: Negative.   Respiratory: Negative.   Cardiovascular: Negative.   Gastrointestinal: Negative.   Genitourinary: Negative.   Musculoskeletal:  Positive for joint pain.  Skin: Negative.   Neurological: Negative.   Endo/Heme/Allergies: Negative.   Psychiatric/Behavioral: Negative.     Last menstrual period 05/01/2013. Physical Exam  Constitutional: She is oriented to person, place, and time. She appears well-developed and well-nourished.  HENT:  Head: Normocephalic and atraumatic.  Eyes: Conjunctivae and EOM are normal. Pupils are equal, round, and reactive to light.   Neck: Normal range of motion. Neck supple.  Cardiovascular: Normal rate and regular rhythm.   Respiratory: Effort normal and breath sounds normal.  GI: Soft. Bowel sounds are normal.  Musculoskeletal:  General Mental Status - Alert. General Appearance- pleasant and In acute distress. Orientation- Oriented X3. Build & Nutrition- Obese. Gait- Use of assistive device and Antalgic. Mental Status- Alert.  Peripheral Vascular Lower Extremity: Palpation:Tenderness- Right- no calf tenderness to palpation. Posterior tibial pulse- Bilateral- 2+. Dorsalis pedis pulse- Bilateral- 2+.  Neurologic Sensation:Lower Extremity- Right- sensation is intact in the lower extremity, unless otherwise mentioned.  Right Lower Extremity: Right Ankle: Inspection and Palpation:Clinical Contours- loss of normal clinical contours. Tenderness- Achilles tendon tender to palpation and Achilles tendon insertion tender to palpation. no tenderness to palpation of the calcaneus, no tenderness to palpation of the lateral malleolus, no tenderness to palpation of the medial malleolus, no tenderness to palpation of the anterior talofibular ligament (ATFL), no tenderness to palpation of the deltoid ligament, no tenderness to palpation of the base of the 5th metatarsal and no tenderness to palpation of the calf. Swelling- moderate. Tissue tension/texture is - soft. Sensation is - normal. Other characteristics- ecchymosis present. Achilles:Tenderness- Achilles tendon generally tender to palpation. Warmth- no warmth with palpation of the Achilles tendon. Defect- palpable defect present and visible defect present. Strength and Tone:Testing limited- Note: due to pain. unable to plantarflex the ankle actively. ROM: Plantarflexion:AROM- severely decreased and painful. Dorsiflexion:AROM- moderately decreased and painful. Testing limited- due to pain. Note: pain with passive dorsiflexion of the ankle in  the achilles and calf. Special Tests- Thompson's test positive. Right Foot: Inspection and Palpation:Tenderness- no tenderness to palpation. Swelling- mild. Tissue tension/texture is - sof. Sensation is - normal. Pulses- 2+.  Neurological: She is alert and oriented to person, place, and time. She has normal reflexes.  Skin: Skin is warm and dry.     Xrays from [facility] with no fx, subluxation, dislocation, lytic or blastic lesions.  Assessment/Plan R achilles tendon rupture  Pt with R posterior ankle pain, hx and exam consistent with acute achilles rupture. We discussed relevant anatomy as well as possible tx options. The first option would be to tx conservatively without surgery but this would likely leave this ankle weak and less functional for her, which would not be recommended. The second, recommended option would be to proceed with R achilles tendon repair. We discussed the procedure itself as well as risks, complications and alternatives including but not limited to DVT, PE, infx, bleeding, failure of procedure, need for secondary procedure, anesthesia risk, even death. Discussed typical post-op protocols, NWB status, need for casting, activity modifications, time out of work. All her questions were answered and she desires to proceed. Will proceed accoringly, plan to schedule for 12/22. In the interim, refilled Norco for pain. She will also continue Ibuprofen 800mg  TID with food for pain and swelling, hold 48 hrs pre-op, hold ASA. Recommend ice and elevation, toes above the nose, at least 5-6x/day for 20-67min each to reduce swelling as much as possible. She was given Rx for a rolling kneeling scooter to assist  with her NWB status post-op. She was fitted in a CAM walker with a heel lift and will use crutches as needed for support as well while awaiting surgery. We spent a good deal of time discussing her work; would recommend remaining out of work even while awaiting surgery this week  so she can ice and elevate the leg at home and keep swelling down in preparation, and she will be out immediately post-op and perhaps up to 4-6 weeks, maybe longer, depending on her progress and ability to weightbear. We discussed the possibility of reinjury if she would return to full weightbearing too early and she understands that. Apparently she is able to return in a CAM walker as long as she may weightbear. She does have prior hx MRSA, was given Rx's for decolonization, plan to use Clindamycin 900mg  IV pre-op instead of Vancomycin. She was given a note to remain out of work. She will follow up 10-14 days post-op for staple removal and will call with any questions or concerns in the interim.  Plan Right achilles tendon repair  Andrez Grime M. for Dr. Shelle Iron 05/04/2013, 1:54 PM

## 2013-05-05 ENCOUNTER — Encounter (HOSPITAL_COMMUNITY): Payer: Self-pay | Admitting: Pharmacy Technician

## 2013-05-06 ENCOUNTER — Other Ambulatory Visit (HOSPITAL_COMMUNITY): Payer: Self-pay | Admitting: Orthopaedic Surgery

## 2013-05-06 NOTE — Patient Instructions (Addendum)
20 Erin Vaughn  05/06/2013   Your procedure is scheduled on: Monday December 22nd  Report to [facility] at 100 PM  Call this number if you have problems the morning of surgery [phone]   Remember:   Do not eat food  :After Midnight.   clear liquids midnight until 930 am day of surgery, then nothing by mouth after 930 am day of surgery.   Take these medicines the morning of surgery with A SIP OF WATER: wellbutrin, hydrocodone                                SEE [place] PREPARING FOR SURGERY SHEET             You may not have any metal on your body including hair pins and piercings  Do not wear jewelry, make-up.  Do not wear lotions, powders, or perfumes. You may wear deodorant.   Men may shave face and neck.  Do not bring valuables to the hospital. [place] [facility] IS NOT RESPONSIBLE FOR VALUEABLES.  Contacts, dentures or bridgework may not be worn into surgery.  Leave suitcase in the car. After surgery it may be brought to your room.  For patients admitted to the hospital, checkout time is 11:00 AM the day of discharge.   Patients discharged the day of surgery will not be allowed to drive home.  Name and phone number of your driver:  Special Instructions: N/A   Please read over the following fact sheets that you were given: MRSA information, [place] preparing for surgery sheet  Call Cain Sieve RN pre op nurse if needed [phone]    FAILURE TO FOLLOW THESE INSTRUCTIONS MAY RESULT IN THE CANCELLATION OF YOUR SURGERY.  PATIENT SIGNATURE___________________________________________  NURSE SIGNATURE_____________________________________________

## 2013-05-07 ENCOUNTER — Encounter (HOSPITAL_COMMUNITY): Payer: Self-pay

## 2013-05-07 ENCOUNTER — Encounter (HOSPITAL_COMMUNITY)
Admission: RE | Admit: 2013-05-07 | Discharge: 2013-05-07 | Disposition: A | Discharge status: Home / Self Care | Payer: BC Managed Care – PPO | Source: Ambulatory Visit | Attending: Specialist | Admitting: Specialist

## 2013-05-07 HISTORY — DX: Adverse effect of unspecified anesthetic, initial encounter: T41.45XA

## 2013-05-07 HISTORY — DX: Gastro-esophageal reflux disease without esophagitis: K21.9

## 2013-05-07 HISTORY — DX: Depression, unspecified: F32.A

## 2013-05-07 HISTORY — DX: Major depressive disorder, single episode, unspecified: F32.9

## 2013-05-07 HISTORY — DX: Strain of unspecified achilles tendon, initial encounter: S86.019A

## 2013-05-07 LAB — CBC
HCT: 41 % (ref 36.0–46.0)
Hemoglobin: 13.7 g/dL (ref 12.0–15.0)
MCH: 30.2 pg (ref 26.0–34.0)
MCHC: 33.4 g/dL (ref 30.0–36.0)
MCV: 90.3 fL (ref 78.0–100.0)
RDW: 13.2 % (ref 11.5–15.5)

## 2013-05-10 ENCOUNTER — Ambulatory Visit (HOSPITAL_COMMUNITY)
Admission: RE | Admit: 2013-05-10 | Discharge: 2013-05-11 | Disposition: A | Discharge status: Home / Self Care | Payer: BC Managed Care – PPO | Source: Ambulatory Visit | Attending: Specialist | Admitting: Specialist

## 2013-05-10 ENCOUNTER — Encounter (HOSPITAL_COMMUNITY): Payer: Self-pay | Admitting: *Deleted

## 2013-05-10 ENCOUNTER — Ambulatory Visit (HOSPITAL_COMMUNITY): Payer: BC Managed Care – PPO | Admitting: Anesthesiology

## 2013-05-10 ENCOUNTER — Encounter (HOSPITAL_COMMUNITY): Payer: BC Managed Care – PPO | Admitting: Anesthesiology

## 2013-05-10 ENCOUNTER — Encounter (HOSPITAL_COMMUNITY)
Admission: RE | Disposition: A | Discharge status: Home / Self Care | Payer: Self-pay | Source: Ambulatory Visit | Attending: Specialist

## 2013-05-10 DIAGNOSIS — Y92009 Unspecified place in unspecified non-institutional (private) residence as the place of occurrence of the external cause: Secondary | ICD-10-CM | POA: Insufficient documentation

## 2013-05-10 DIAGNOSIS — S91309A Unspecified open wound, unspecified foot, initial encounter: Secondary | ICD-10-CM | POA: Insufficient documentation

## 2013-05-10 DIAGNOSIS — K219 Gastro-esophageal reflux disease without esophagitis: Secondary | ICD-10-CM | POA: Insufficient documentation

## 2013-05-10 DIAGNOSIS — Y93K9 Activity, other involving animal care: Secondary | ICD-10-CM | POA: Insufficient documentation

## 2013-05-10 DIAGNOSIS — M19079 Primary osteoarthritis, unspecified ankle and foot: Secondary | ICD-10-CM | POA: Insufficient documentation

## 2013-05-10 DIAGNOSIS — R110 Nausea: Secondary | ICD-10-CM | POA: Insufficient documentation

## 2013-05-10 DIAGNOSIS — X500XXA Overexertion from strenuous movement or load, initial encounter: Secondary | ICD-10-CM | POA: Insufficient documentation

## 2013-05-10 DIAGNOSIS — S86019A Strain of unspecified Achilles tendon, initial encounter: Secondary | ICD-10-CM

## 2013-05-10 DIAGNOSIS — Z87891 Personal history of nicotine dependence: Secondary | ICD-10-CM | POA: Insufficient documentation

## 2013-05-10 DIAGNOSIS — S96909A Unspecified injury of unspecified muscle and tendon at ankle and foot level, unspecified foot, initial encounter: Secondary | ICD-10-CM | POA: Insufficient documentation

## 2013-05-10 DIAGNOSIS — S86011A Strain of right Achilles tendon, initial encounter: Secondary | ICD-10-CM

## 2013-05-10 DIAGNOSIS — S86011D Strain of right Achilles tendon, subsequent encounter: Secondary | ICD-10-CM

## 2013-05-10 HISTORY — PX: ACHILLES TENDON SURGERY: SHX542

## 2013-05-10 LAB — CBC
HCT: 36.7 % (ref 36.0–46.0)
Hemoglobin: 12.1 g/dL (ref 12.0–15.0)
MCHC: 33 g/dL (ref 30.0–36.0)
MCV: 90.6 fL (ref 78.0–100.0)
Platelets: 268 10*3/uL (ref 150–400)
RBC: 4.05 MIL/uL (ref 3.87–5.11)

## 2013-05-10 LAB — CREATININE, SERUM
Creatinine, Ser: 0.65 mg/dL (ref 0.50–1.10)
GFR calc Af Amer: >90 mL/min (ref >90)
GFR calc non Af Amer: >90 mL/min (ref >90)

## 2013-05-10 SURGERY — REPAIR, TENDON, ACHILLES
Anesthesia: General | Site: Ankle | Laterality: Right

## 2013-05-10 MED ORDER — HYDROMORPHONE HCL PF 1 MG/ML IJ SOLN
0.2500 mg | INTRAMUSCULAR | Status: DC | PRN
  Administered 2013-05-10 (×2): 0.25 mg via INTRAVENOUS

## 2013-05-10 MED ORDER — NORETHINDRONE 0.35 MG PO TABS: 1.0000 | ORAL_TABLET | Freq: Every day | ORAL | Status: DC

## 2013-05-10 MED ORDER — DOCUSATE SODIUM 100 MG PO CAPS
100.0000 mg | ORAL_CAPSULE | Freq: Two times a day (BID) | ORAL | Status: DC
  Administered 2013-05-11: 100 mg via ORAL

## 2013-05-10 MED ORDER — ONDANSETRON HCL 4 MG/2ML IJ SOLN
INTRAMUSCULAR | Status: DC | PRN
  Administered 2013-05-10: 4 mg via INTRAVENOUS

## 2013-05-10 MED ORDER — BUPIVACAINE HCL (PF) 0.5 % IJ SOLN
INTRAMUSCULAR | Status: AC
  Filled 2013-05-10: qty 30

## 2013-05-10 MED ORDER — FENTANYL CITRATE 0.05 MG/ML IJ SOLN
INTRAMUSCULAR | Status: AC
  Filled 2013-05-10: qty 5

## 2013-05-10 MED ORDER — ROCURONIUM BROMIDE 100 MG/10ML IV SOLN
INTRAVENOUS | Status: AC
  Filled 2013-05-10: qty 1

## 2013-05-10 MED ORDER — METHOCARBAMOL 500 MG PO TABS: 500.0000 mg | ORAL_TABLET | Freq: Three times a day (TID) | ORAL | Status: DC | PRN

## 2013-05-10 MED ORDER — METHOCARBAMOL 100 MG/ML IJ SOLN
500.0000 mg | Freq: Four times a day (QID) | INTRAVENOUS | Status: DC | PRN
  Filled 2013-05-10 (×2): qty 5

## 2013-05-10 MED ORDER — LIDOCAINE HCL (CARDIAC) 20 MG/ML IV SOLN
INTRAVENOUS | Status: AC
  Filled 2013-05-10: qty 5

## 2013-05-10 MED ORDER — METHOCARBAMOL 500 MG PO TABS: 500.0000 mg | ORAL_TABLET | Freq: Four times a day (QID) | ORAL | Status: DC | PRN

## 2013-05-10 MED ORDER — OXYCODONE-ACETAMINOPHEN 5-325 MG PO TABS
1.0000 | ORAL_TABLET | ORAL | Status: DC | PRN
  Administered 2013-05-11 (×2): 2 via ORAL
  Filled 2013-05-10 (×2): qty 2

## 2013-05-10 MED ORDER — LIDOCAINE HCL (PF) 2 % IJ SOLN
INTRAMUSCULAR | Status: DC | PRN
  Administered 2013-05-10: 75 mg via INTRADERMAL

## 2013-05-10 MED ORDER — BUPIVACAINE HCL (PF) 0.5 % IJ SOLN
INTRAMUSCULAR | Status: DC | PRN
  Administered 2013-05-10: 10 mL

## 2013-05-10 MED ORDER — OXYCODONE-ACETAMINOPHEN 7.5-325 MG PO TABS: 1.0000 | ORAL_TABLET | ORAL | Status: DC | PRN

## 2013-05-10 MED ORDER — ONDANSETRON HCL 4 MG PO TABS: 4.0000 mg | ORAL_TABLET | Freq: Four times a day (QID) | ORAL | Status: DC | PRN

## 2013-05-10 MED ORDER — SODIUM CHLORIDE 0.9 % IR SOLN
Status: DC | PRN
  Administered 2013-05-10: 18:00:00

## 2013-05-10 MED ORDER — DOCUSATE SODIUM 100 MG PO CAPS: 100.0000 mg | ORAL_CAPSULE | Freq: Two times a day (BID) | ORAL | Status: DC

## 2013-05-10 MED ORDER — DEXAMETHASONE SODIUM PHOSPHATE 10 MG/ML IJ SOLN
INTRAMUSCULAR | Status: DC | PRN
  Administered 2013-05-10: 10 mg via INTRAVENOUS

## 2013-05-10 MED ORDER — CLINDAMYCIN PHOSPHATE 900 MG/50ML IV SOLN
INTRAVENOUS | Status: AC
  Filled 2013-05-10: qty 50

## 2013-05-10 MED ORDER — KCL IN DEXTROSE-NACL 20-5-0.45 MEQ/L-%-% IV SOLN
INTRAVENOUS | Status: DC
  Administered 2013-05-10: 19:00:00 via INTRAVENOUS
  Filled 2013-05-10 (×2): qty 1000

## 2013-05-10 MED ORDER — CLINDAMYCIN PHOSPHATE 900 MG/50ML IV SOLN
900.0000 mg | Freq: Four times a day (QID) | INTRAVENOUS | Status: AC
  Administered 2013-05-10 – 2013-05-11 (×3): 900 mg via INTRAVENOUS
  Filled 2013-05-10 (×3): qty 50

## 2013-05-10 MED ORDER — MIDAZOLAM HCL 2 MG/2ML IJ SOLN
INTRAMUSCULAR | Status: AC
  Filled 2013-05-10: qty 2

## 2013-05-10 MED ORDER — PROPOFOL 10 MG/ML IV BOLUS
INTRAVENOUS | Status: AC
  Filled 2013-05-10: qty 20

## 2013-05-10 MED ORDER — MONTELUKAST SODIUM 10 MG PO TABS
10.0000 mg | ORAL_TABLET | Freq: Every day | ORAL | Status: DC
  Filled 2013-05-10 (×2): qty 1

## 2013-05-10 MED ORDER — ENOXAPARIN SODIUM 40 MG/0.4ML ~~LOC~~ SOLN
40.0000 mg | SUBCUTANEOUS | Status: DC
  Administered 2013-05-10: 40 mg via SUBCUTANEOUS
  Filled 2013-05-10 (×2): qty 0.4

## 2013-05-10 MED ORDER — HYDROMORPHONE HCL PF 1 MG/ML IJ SOLN
INTRAMUSCULAR | Status: AC
  Filled 2013-05-10: qty 1

## 2013-05-10 MED ORDER — CLINDAMYCIN PHOSPHATE 900 MG/50ML IV SOLN
900.0000 mg | Freq: Once | INTRAVENOUS | Status: AC
  Administered 2013-05-10: 900 mg via INTRAVENOUS
  Filled 2013-05-10: qty 50

## 2013-05-10 MED ORDER — METOCLOPRAMIDE HCL 10 MG PO TABS: 5.0000 mg | ORAL_TABLET | Freq: Three times a day (TID) | ORAL | Status: DC | PRN

## 2013-05-10 MED ORDER — DEXAMETHASONE SODIUM PHOSPHATE 10 MG/ML IJ SOLN
INTRAMUSCULAR | Status: AC
  Filled 2013-05-10: qty 1

## 2013-05-10 MED ORDER — METOCLOPRAMIDE HCL 5 MG/ML IJ SOLN
5.0000 mg | Freq: Three times a day (TID) | INTRAMUSCULAR | Status: DC | PRN
  Administered 2013-05-10: 10 mg via INTRAVENOUS
  Filled 2013-05-10: qty 2

## 2013-05-10 MED ORDER — ONDANSETRON HCL 4 MG/2ML IJ SOLN
INTRAMUSCULAR | Status: AC
  Filled 2013-05-10: qty 2

## 2013-05-10 MED ORDER — HYDROCODONE-ACETAMINOPHEN 5-325 MG PO TABS: 1.0000 | ORAL_TABLET | ORAL | Status: DC | PRN

## 2013-05-10 MED ORDER — BUPIVACAINE-EPINEPHRINE PF 0.5-1:200000 % IJ SOLN
INTRAMUSCULAR | Status: AC
  Filled 2013-05-10: qty 30

## 2013-05-10 MED ORDER — BUPROPION HCL ER (SR) 100 MG PO TB12
200.0000 mg | ORAL_TABLET | Freq: Two times a day (BID) | ORAL | Status: DC
  Administered 2013-05-11 (×2): 200 mg via ORAL
  Filled 2013-05-10 (×3): qty 2

## 2013-05-10 MED ORDER — FAMOTIDINE 10 MG PO TABS
10.0000 mg | ORAL_TABLET | Freq: Two times a day (BID) | ORAL | Status: DC
  Administered 2013-05-11: 10 mg via ORAL
  Filled 2013-05-10 (×3): qty 1

## 2013-05-10 MED ORDER — SUCCINYLCHOLINE CHLORIDE 20 MG/ML IJ SOLN
INTRAMUSCULAR | Status: DC | PRN
  Administered 2013-05-10 (×2): 100 mg via INTRAVENOUS

## 2013-05-10 MED ORDER — MIDAZOLAM HCL 5 MG/5ML IJ SOLN
INTRAMUSCULAR | Status: DC | PRN
  Administered 2013-05-10: 1 mg via INTRAVENOUS
  Administered 2013-05-10: 2 mg via INTRAVENOUS

## 2013-05-10 MED ORDER — LACTATED RINGERS IV SOLN
INTRAVENOUS | Status: DC
  Administered 2013-05-10: 16:00:00 via INTRAVENOUS

## 2013-05-10 MED ORDER — HYDROMORPHONE HCL PF 2 MG/ML IJ SOLN
INTRAMUSCULAR | Status: AC
  Filled 2013-05-10: qty 1

## 2013-05-10 MED ORDER — PROMETHAZINE HCL 25 MG/ML IJ SOLN: 6.2500 mg | INTRAMUSCULAR | Status: DC | PRN

## 2013-05-10 MED ORDER — PROMETHAZINE HCL 25 MG/ML IJ SOLN
12.5000 mg | Freq: Four times a day (QID) | INTRAMUSCULAR | Status: DC | PRN
  Administered 2013-05-10: 12.5 mg via INTRAVENOUS
  Filled 2013-05-10: qty 1

## 2013-05-10 MED ORDER — FENTANYL CITRATE 0.05 MG/ML IJ SOLN
INTRAMUSCULAR | Status: DC | PRN
  Administered 2013-05-10: 100 ug via INTRAVENOUS
  Administered 2013-05-10: 50 ug via INTRAVENOUS

## 2013-05-10 MED ORDER — ONDANSETRON HCL 4 MG/2ML IJ SOLN: 4.0000 mg | Freq: Four times a day (QID) | INTRAMUSCULAR | Status: DC | PRN

## 2013-05-10 MED ORDER — HYDROMORPHONE HCL PF 1 MG/ML IJ SOLN
INTRAMUSCULAR | Status: DC | PRN
  Administered 2013-05-10 (×2): 1 mg via INTRAVENOUS

## 2013-05-10 MED ORDER — PROPOFOL 10 MG/ML IV BOLUS
INTRAVENOUS | Status: DC | PRN
  Administered 2013-05-10: 200 mg via INTRAVENOUS

## 2013-05-10 SURGICAL SUPPLY — 57 items
ANCH SUT PUSHLCK 19.5X3.5 STRL (Anchor) IMPLANT
ANCHOR PUSHLOCK PEEK 3.5X19.5 (Anchor) IMPLANT
BAG SPEC THK2 15X12 ZIP CLS (MISCELLANEOUS) ×1
BAG ZIPLOCK 12X15 (MISCELLANEOUS) ×2 IMPLANT
BANDAGE ESMARK 6X9 LF (GAUZE/BANDAGES/DRESSINGS) ×1 IMPLANT
BIT DRILL 2.8X128 (BIT) ×2 IMPLANT
BLADE SURG SZ10 CARB STEEL (BLADE) ×4 IMPLANT
BNDG CMPR 9X6 STRL LF SNTH (GAUZE/BANDAGES/DRESSINGS) ×1
BNDG ESMARK 6X9 LF (GAUZE/BANDAGES/DRESSINGS) ×2
CHLORAPREP W/TINT 26ML (MISCELLANEOUS) IMPLANT
CLOTH 2% CHLOROHEXIDINE 3PK (PERSONAL CARE ITEMS) ×2 IMPLANT
CUFF TOURN SGL QUICK 34 (TOURNIQUET CUFF) ×2
CUFF TRNQT CYL 34X4X40X1 (TOURNIQUET CUFF) ×1 IMPLANT
DRSG EMULSION OIL 3X16 NADH (GAUZE/BANDAGES/DRESSINGS) ×2 IMPLANT
DURAPREP 26ML APPLICATOR (WOUND CARE) ×2 IMPLANT
ELECT REM PT RETURN 9FT ADLT (ELECTROSURGICAL) ×2
ELECTRODE REM PT RTRN 9FT ADLT (ELECTROSURGICAL) ×1 IMPLANT
GLOVE BIOGEL PI IND STRL 7.5 (GLOVE) ×1 IMPLANT
GLOVE BIOGEL PI IND STRL 8 (GLOVE) ×1 IMPLANT
GLOVE BIOGEL PI INDICATOR 7.5 (GLOVE) ×1
GLOVE BIOGEL PI INDICATOR 8 (GLOVE) ×1
GLOVE SURG SS PI 7.5 STRL IVOR (GLOVE) ×2 IMPLANT
GLOVE SURG SS PI 8.0 STRL IVOR (GLOVE) ×4 IMPLANT
GOWN PREVENTION PLUS LG XLONG (DISPOSABLE) ×1 IMPLANT
GOWN PREVENTION PLUS XLARGE (GOWN DISPOSABLE) ×2 IMPLANT
GOWN STRL REIN XL XLG (GOWN DISPOSABLE) ×4 IMPLANT
KIT BASIN OR (CUSTOM PROCEDURE TRAY) ×2 IMPLANT
MANIFOLD NEPTUNE II (INSTRUMENTS) ×2 IMPLANT
NDL MA TROC 1/2 (NEEDLE) ×1 IMPLANT
NEEDLE HYPO 22GX1.5 SAFETY (NEEDLE) ×1 IMPLANT
NEEDLE MA TROC 1/2 (NEEDLE) IMPLANT
NS IRRIG 1000ML POUR BTL (IV SOLUTION) ×1 IMPLANT
PACK LOWER EXTREMITY WL (CUSTOM PROCEDURE TRAY) ×2 IMPLANT
PAD CAST 4YDX4 CTTN HI CHSV (CAST SUPPLIES) ×1 IMPLANT
PADDING CAST ABS 4INX4YD NS (CAST SUPPLIES) ×1
PADDING CAST ABS 6INX4YD NS (CAST SUPPLIES) ×1
PADDING CAST ABS COTTON 4X4 ST (CAST SUPPLIES) IMPLANT
PADDING CAST ABS COTTON 6X4 NS (CAST SUPPLIES) IMPLANT
PADDING CAST COTTON 4X4 STRL (CAST SUPPLIES) ×2
PADDING CAST COTTON 6X4 STRL (CAST SUPPLIES) ×2 IMPLANT
POSITIONER SURGICAL ARM (MISCELLANEOUS) ×2 IMPLANT
SCOTCHCAST PLUS 4X4 WHITE (CAST SUPPLIES) ×3 IMPLANT
SET PAD KNEE POSITIONER (MISCELLANEOUS) ×2 IMPLANT
SLEEVE SURGEON STRL (DRAPES) ×1 IMPLANT
SPONGE GAUZE 4X4 12PLY (GAUZE/BANDAGES/DRESSINGS) ×3 IMPLANT
SPONGE LAP 4X18 X RAY DECT (DISPOSABLE) ×2 IMPLANT
STAPLER VISISTAT (STAPLE) ×2 IMPLANT
SUCTION FRAZIER 12FR DISP (SUCTIONS) ×1 IMPLANT
SUT ETHIBOND 2 (SUTURE) ×1 IMPLANT
SUT FIBERWIRE #2 38 T-5 BLUE (SUTURE) ×4
SUT VIC AB 1 CT1 36 (SUTURE) ×2 IMPLANT
SUT VIC AB 2-0 CT1 27 (SUTURE) ×4
SUT VIC AB 2-0 CT1 TAPERPNT 27 (SUTURE) IMPLANT
SUT VICRYL 0 27 CT2 27 ABS (SUTURE) ×2 IMPLANT
SUTURE FIBERWR #2 38 T-5 BLUE (SUTURE) IMPLANT
SYR CONTROL 10ML LL (SYRINGE) ×1 IMPLANT
WATER STERILE IRR 1500ML POUR (IV SOLUTION) ×1 IMPLANT

## 2013-05-10 NOTE — Progress Notes (Signed)
Annice Pih PA called reg additional nausea medication. Zofran and Reglan ineffective. Order received for Phenergan. Also confirmed that current contact precautions can be dcd.

## 2013-05-10 NOTE — Transfer of Care (Signed)
Immediate Anesthesia Transfer of Care Note  Patient: Erin Vaughn  Procedure(s) Performed: Procedure(s) with comments: RIGHT ACHILLES TENDON REPAIR (Right) - achilles tendon  Patient Location: PACU  Anesthesia Type:General  Level of Consciousness: awake, alert  and patient cooperative  Airway & Oxygen Therapy: Patient Spontanous Breathing and Patient connected to face mask oxygen  Post-op Assessment: Report given to PACU RN, Post -op Vital signs reviewed and stable and Patient moving all extremities X 4  Post vital signs: Reviewed and stable  Complications: No apparent anesthesia complications

## 2013-05-10 NOTE — H&P (View-Only) (Signed)
Erin Vaughn is an 42 y.o. female.   Chief Complaint: right posterior ankle pain  HPI: The patient is a 42 year old female who presents with ankle complaints. The patient reports right ankle symptoms which began 2 day(s) ago following a specific injury. The injury occurred while the patient was at home. The activity involved running (chasing her dog, felt a sudden pop in her right ankle/calf) Symptoms are reported to be located in the right posterior ankle and include ankle pain, swelling, ecchymosis, decreased range of motion, difficulty bearing weight, difficulty ambulating and audible pop at the time of injury. The patient reports that symptoms radiate to the right lower leg (calf). The patient does describe their pain as sharp. Onset of symptoms was beginning immediately after the injury. The patient describes their symptoms as severe and does feel that they are unchanged. Symptoms are exacerbated by weight bearing. Symptoms are relieved by rest. Prior to being seen today the patient was previously evaluated in the emergency room. Symptoms present at the patient's previous evaluation included ankle pain, swelling, warmth, redness, an audible pop at the time of injury and difficulty bearing weight. Previous work-up for this problem has included ankle x-rays. Past treatment for this problem has included posterior splint and crutches (partial weight bearing). Note for "Ankle pain": [name] presents today with her son. Reports R posterior ankle pain (achilles region) x 2 days, which started suddenly when chasing her dog at home, planted her foot while running and felt a sudden pop and pain, fell onto her couch on her right side. Denies other injuries. She does report within the last few months hitting her achilles region against a shopping cart twice, for which she saw her PCP and was working on some ROM exercises. She was still able to ambulate normally and push off her toes. Since her injury 2 days ago she  cannot push off her toes. She is using crutches and partially weightbearing. She is taking Ibuprofen 800mg TID with food with relief. She was also given Rx for Norco but has only taken it once, which did help with pain. 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 She does report prior hx of MRSA in 2010. Previously received Vanco with Red Man syndrome rxn as well as diffuse itching. Denies Hx DVT.   Past Medical History  Diagnosis Date  . Anxiety     Past Surgical History  Procedure Laterality Date  . Cholecystectomy      Family History  Problem Relation Age of Onset  . Cancer Mother   . COPD Other   . Hypertension Other   . Asthma Other    Social History:  reports that she quit smoking about 5 weeks ago. Her smoking use included Cigarettes. She has a 7.5 pack-year smoking history. She has never used smokeless tobacco. She reports that she does not drink alcohol or use illicit drugs.  Allergies:  Allergies  Allergen Reactions  . Vancomycin     Burning and itching. Also makes body red  . Zithromax [Azithromycin] Swelling     (Not in a hospital admission)  No results found for this or any previous visit (from the past 48 hour(s)). No results found.  Review of Systems  Constitutional: Negative.   HENT: Negative.   Eyes: Negative.   Respiratory: Negative.   Cardiovascular: Negative.   Gastrointestinal: Negative.   Genitourinary: Negative.   Musculoskeletal:   Positive for joint pain.  Skin: Negative.   Neurological: Negative.   Endo/Heme/Allergies: Negative.   Psychiatric/Behavioral: Negative.     Last menstrual period 05/01/2013. Physical Exam  Constitutional: She is oriented to person, place, and time. She appears well-developed and well-nourished.  HENT:  Head: Normocephalic and atraumatic.  Eyes: Conjunctivae and EOM are normal. Pupils are equal, round, and reactive to light.   Neck: Normal range of motion. Neck supple.  Cardiovascular: Normal rate and regular rhythm.   Respiratory: Effort normal and breath sounds normal.  GI: Soft. Bowel sounds are normal.  Musculoskeletal:  General Mental Status - Alert. General Appearance- pleasant and In acute distress. Orientation- Oriented X3. Build & Nutrition- Obese. Gait- Use of assistive device and Antalgic. Mental Status- Alert.  Peripheral Vascular Lower Extremity: Palpation:Tenderness- Right- no calf tenderness to palpation. Posterior tibial pulse- Bilateral- 2+. Dorsalis pedis pulse- Bilateral- 2+.  Neurologic Sensation:Lower Extremity- Right- sensation is intact in the lower extremity, unless otherwise mentioned.  Right Lower Extremity: Right Ankle: Inspection and Palpation:Clinical Contours- loss of normal clinical contours. Tenderness- Achilles tendon tender to palpation and Achilles tendon insertion tender to palpation. no tenderness to palpation of the calcaneus, no tenderness to palpation of the lateral malleolus, no tenderness to palpation of the medial malleolus, no tenderness to palpation of the anterior talofibular ligament (ATFL), no tenderness to palpation of the deltoid ligament, no tenderness to palpation of the base of the 5th metatarsal and no tenderness to palpation of the calf. Swelling- moderate. Tissue tension/texture is - soft. Sensation is - normal. Other characteristics- ecchymosis present. Achilles:Tenderness- Achilles tendon generally tender to palpation. Warmth- no warmth with palpation of the Achilles tendon. Defect- palpable defect present and visible defect present. Strength and Tone:Testing limited- Note: due to pain. unable to plantarflex the ankle actively. ROM: Plantarflexion:AROM- severely decreased and painful. Dorsiflexion:AROM- moderately decreased and painful. Testing limited- due to pain. Note: pain with passive dorsiflexion of the ankle in  the achilles and calf. Special Tests- Thompson's test positive. Right Foot: Inspection and Palpation:Tenderness- no tenderness to palpation. Swelling- mild. Tissue tension/texture is - sof. Sensation is - normal. Pulses- 2+.  Neurological: She is alert and oriented to person, place, and time. She has normal reflexes.  Skin: Skin is warm and dry.     Xrays from  with no fx, subluxation, dislocation, lytic or blastic lesions.  Assessment/Plan R achilles tendon rupture  Pt with R posterior ankle pain, hx and exam consistent with acute achilles rupture. We discussed relevant anatomy as well as possible tx options. The first option would be to tx conservatively without surgery but this would likely leave this ankle weak and less functional for her, which would not be recommended. The second, recommended option would be to proceed with R achilles tendon repair. We discussed the procedure itself as well as risks, complications and alternatives including but not limited to DVT, PE, infx, bleeding, failure of procedure, need for secondary procedure, anesthesia risk, even death. Discussed typical post-op protocols, NWB status, need for casting, activity modifications, time out of work. All her questions were answered and she desires to proceed. Will proceed accoringly, plan to schedule for 12/22. In the interim, refilled Norco for pain. She will also continue Ibuprofen 800mg TID with food for pain and swelling, hold 48 hrs pre-op, hold ASA. Recommend ice and elevation, toes above the nose, at least 5-6x/day for 20-30min each to reduce swelling as much as possible. She was given Rx for a rolling kneeling scooter to assist   with her NWB status post-op. She was fitted in a CAM walker with a heel lift and will use crutches as needed for support as well while awaiting surgery. We spent a good deal of time discussing her work; would recommend remaining out of work even while awaiting surgery this week  so she can ice and elevate the leg at home and keep swelling down in preparation, and she will be out immediately post-op and perhaps up to 4-6 weeks, maybe longer, depending on her progress and ability to weightbear. We discussed the possibility of reinjury if she would return to full weightbearing too early and she understands that. Apparently she is able to return in a CAM walker as long as she may weightbear. She does have prior hx MRSA, was given Rx's for decolonization, plan to use Clindamycin 900mg IV pre-op instead of Vancomycin. She was given a note to remain out of work. She will follow up 10-14 days post-op for staple removal and will call with any questions or concerns in the interim.  Plan Right achilles tendon repair  BISSELL, JACLYN M. for Dr. Beane 05/04/2013, 1:54 PM    

## 2013-05-10 NOTE — Anesthesia Preprocedure Evaluation (Signed)
Anesthesia Evaluation  Patient identified by MRN, date of birth, ID band Patient awake    Reviewed: Allergy & Precautions, H&P , NPO status , Patient's Chart, lab work & pertinent test results  History of Anesthesia Complications (+) history of anesthetic complications  Airway Mallampati: II TM Distance: >3 FB Neck ROM: Full    Dental no notable dental hx.    Pulmonary neg pulmonary ROS, former smoker,  breath sounds clear to auscultation  Pulmonary exam normal       Cardiovascular Exercise Tolerance: Good negative cardio ROS  Rhythm:Regular Rate:Normal     Neuro/Psych PSYCHIATRIC DISORDERS Anxiety Depression  Neuromuscular disease negative neurological ROS  negative psych ROS   GI/Hepatic negative GI ROS, Neg liver ROS, GERD-  Medicated,  Endo/Other  Morbid obesity  Renal/GU negative Renal ROS  negative genitourinary   Musculoskeletal negative musculoskeletal ROS (+)   Abdominal (+) + obese,   Peds negative pediatric ROS (+)  Hematology negative hematology ROS (+)   Anesthesia Other Findings   Reproductive/Obstetrics negative OB ROS                           Anesthesia Physical Anesthesia Plan  ASA: III  Anesthesia Plan: General   Post-op Pain Management:    Induction: Intravenous  Airway Management Planned: Oral ETT  Additional Equipment:   Intra-op Plan:   Post-operative Plan: Extubation in OR  Informed Consent: I have reviewed the patients History and Physical, chart, labs and discussed the procedure including the risks, benefits and alternatives for the proposed anesthesia with the patient or authorized representative who has indicated his/her understanding and acceptance.   Dental advisory given  Plan Discussed with: CRNA  Anesthesia Plan Comments:         Anesthesia Quick Evaluation

## 2013-05-10 NOTE — Interval H&P Note (Signed)
History and Physical Interval Note:  05/10/2013 12:53 PM  Erin Vaughn  has presented today for surgery, with the diagnosis of RIGHT ACHILLES TENDON RUPTURE  The various methods of treatment have been discussed with the patient and family. After consideration of risks, benefits and other options for treatment, the patient has consented to  Procedure(s): RIGHT ACHILLES TENDON REPAIR (Right) as a surgical intervention .  The patient's history has been reviewed, patient examined, no change in status, stable for surgery.  I have reviewed the patient's chart and labs.  Questions were answered to the patient's satisfaction.     Monae Topping C

## 2013-05-10 NOTE — Anesthesia Postprocedure Evaluation (Signed)
  Anesthesia Post-op Note  Patient: Erin Vaughn  Procedure(s) Performed: Procedure(s) (LRB): RIGHT ACHILLES TENDON REPAIR (Right)  Patient Location: PACU  Anesthesia Type: General  Level of Consciousness: awake and alert   Airway and Oxygen Therapy: Patient Spontanous Breathing  Post-op Pain: mild  Post-op Assessment: Post-op Vital signs reviewed, Patient's Cardiovascular Status Stable, Respiratory Function Stable, Patent Airway and No signs of Nausea or vomiting  Last Vitals:  Filed Vitals:   05/10/13 2128  BP: 120/75  Pulse: 73  Temp: 36.6 C  Resp: 16    Post-op Vital Signs: stable   Complications: No apparent anesthesia complications

## 2013-05-10 NOTE — Brief Op Note (Signed)
05/10/2013  5:37 PM  PATIENT:  Elayne Snare  42 y.o. female  PRE-OPERATIVE DIAGNOSIS:  RIGHT ACHILLES TENDON RUPTURE  POST-OPERATIVE DIAGNOSIS:  * No post-op diagnosis entered *  PROCEDURE:  Procedure(s) with comments: RIGHT ACHILLES TENDON REPAIR (Right) - achilles tendon  SURGEON:  Surgeon(s) and Role:    * Javier Docker, MD - Primary  PHYSICIAN ASSISTANT:   ASSISTANTS: Bissell   ANESTHESIA:   general  EBL:     BLOOD ADMINISTERED:none  DRAINS: none   LOCAL MEDICATIONS USED:  MARCAINE     SPECIMEN:  No Specimen  DISPOSITION OF SPECIMEN:  N/A  COUNTS:  YES  TOURNIQUET:  * No tourniquets in log *  DICTATION: .Other Dictation: Dictation Number [id number]  PLAN OF CARE: Admit for overnight observation  PATIENT DISPOSITION:  PACU - hemodynamically stable.   Delay start of Pharmacological VTE agent (>24hrs) due to surgical blood loss or risk of bleeding: no

## 2013-05-11 ENCOUNTER — Encounter (HOSPITAL_COMMUNITY): Payer: Self-pay | Admitting: Specialist

## 2013-05-11 MED ORDER — PROMETHAZINE HCL 12.5 MG PO TABS: 12.5000 mg | ORAL_TABLET | Freq: Four times a day (QID) | ORAL | Status: DC | PRN

## 2013-05-11 NOTE — Progress Notes (Signed)
Physical Therapy Treatment Patient Details Name: Erin Vaughn MRN: [id number] DOB: 1971/01/30 Today's Date: 05/11/2013 Time: 1213-1224 PT Time Calculation (min): 11 min  PT Assessment / Plan / Recommendation  History of Present Illness R achilles tendon repair   PT Comments   Pt is ready for DC. Declines need to [paractice steps. Pt has gotten Knee walker,  Follow Up Recommendations  No PT follow up     Does the patient have the potential to tolerate intense rehabilitation     Barriers to Discharge        Equipment Recommendations   (knee walker.)    Recommendations for Other Services    Frequency 7X/week   Progress towards PT Goals Progress towards PT goals: Progressing toward goals  Plan      Precautions / Restrictions Precautions Precautions: Fall Restrictions Weight Bearing Restrictions: Yes RLE Weight Bearing: Touchdown weight bearing   Pertinent Vitals/Pain More sensation since cast bivalved.   Mobility  Bed Mobility Bed Mobility: Not assessed Supine to Sit: 6: Modified independent (Device/Increase time) Details for Bed Mobility Assistance: seated in recliner Transfers Transfers: Sit to Stand;Stand to Sit;Stand Pivot Transfers Sit to Stand: From chair/3-in-1;4: Min guard Stand to Sit: 4: Min guard;To chair/3-in-1 Stand Pivot Transfers: 4: Min assist Details for Transfer Assistance: son in room and assisted with crutches. Ambulation/Gait Ambulation/Gait Assistance: 4: Min assist Ambulation Distance (Feet): 30 Feet Assistive device: Crutches;Rolling walker Ambulation/Gait Assistance Details:  Pt felt too unsteady on crutches. Gait Pattern: Step-to pattern Stairs: No Stairs Assistance Details (indicate cue type and reason): son present, pt declined need to practice, will have available help and has performed PTA    Exercises     PT Diagnosis: Difficulty walking;Acute pain  PT Problem List: Decreased strength;Decreased activity tolerance;Decreased  balance;Decreased mobility;Pain;Decreased knowledge of use of DME PT Treatment Interventions: DME instruction;Gait training;Stair training;Functional mobility training;Therapeutic activities;Therapeutic exercise;Patient/family education   PT Goals (current goals can now be found in the care plan section) Acute Rehab PT Goals Patient Stated Goal: I want to go back to work PT Goal Formulation: With patient Time For Goal Achievement: 05/13/13 Potential to Achieve Goals: Good  Visit Information  Last PT Received On: 05/11/13 Assistance Needed: +1 History of Present Illness: R achilles tendon repair    Subjective Data  Patient Stated Goal: I want to go back to work   Cognition  Cognition Arousal/Alertness: Awake/alert Behavior During Therapy: WFL for tasks assessed/performed Overall Cognitive Status: Within Functional Limits for tasks assessed    Balance  Balance Balance Assessed: Yes Static Sitting Balance Static Sitting - Balance Support: No upper extremity supported;Feet supported Static Sitting - Level of Assistance: 7: Independent Dynamic Sitting Balance Dynamic Sitting - Balance Support: No upper extremity supported;Feet unsupported;During functional activity Dynamic Sitting - Level of Assistance: 6: Modified independent (Device/Increase time) Static Standing Balance Static Standing - Balance Support: Right upper extremity supported;Left upper extremity supported;Bilateral upper extremity supported;During functional activity Static Standing - Level of Assistance: 4: Min assist;5: Stand by assistance Static Standing - Comment/# of Minutes: with knee walker. Dynamic Standing Balance Dynamic Standing - Balance Support: Right upper extremity supported;Left upper extremity supported;Bilateral upper extremity supported;During functional activity Dynamic Standing - Level of Assistance: 4: Min assist  End of Session PT - End of Session Equipment Utilized During Treatment: Gait  belt Activity Tolerance: Patient tolerated treatment well Patient left: in chair;with call bell/phone within reach Nurse Communication: Mobility status   GP Functional Assessment Tool Used: clinial judgement. Functional Limitation: Mobility: Walking and  moving around Mobility: Walking and Moving Around Current Status [phone]): At least 20 percent but less than 40 percent impaired, limited or restricted Mobility: Walking and Moving Around Goal Status [phone]): At least 1 percent but less than 20 percent impaired, limited or restricted   Rada Hay 05/11/2013, 1:06 PM

## 2013-05-11 NOTE — Care Management Note (Signed)
    Page 1 of 1   05/11/2013     11:27:02 AM   CARE MANAGEMENT NOTE 05/11/2013  Patient:  Erin Vaughn, Erin Vaughn   Account Number:  [id number]  Date Initiated:  05/11/2013  Documentation initiated by:  Colleen Can  Subjective/Objective Assessment:   DX rt achilles tendon rupture; open repair achilles tendon     Action/Plan:   CM spoke with patient & son. Plans are for her to return to her home in [place] where her son & finance' will be caregivers. She already has crutches and knee walker.   Anticipated DC Date:  05/11/2013   Anticipated DC Plan:  HOME/SELF CARE      DC Planning Services  CM consult      Choice offered to / List presented to:             Status of service:  Completed, signed off Medicare Important Message given?   (If response is "NO", the following Medicare IM given date fields will be blank) Date Medicare IM given:   Date Additional Medicare IM given:    Discharge Disposition:  HOME/SELF CARE  Per UR Regulation:    If discussed at Long Length of Stay Meetings, dates discussed:    Comments:

## 2013-05-11 NOTE — Evaluation (Signed)
Occupational Therapy Evaluation Patient Details Name: Erin Vaughn MRN: [id number] DOB: [birth year] Today's Date: 05/11/2013 Time: 4098-1191 OT Time Calculation (min): 15 min  OT Assessment / Plan / Recommendation History of present illness R achilles tendon repair   Clinical Impression   Pt at mod A level with LB ADLs and min guard A with transfers. Pt will d/c home today and stated that have will have 24 hour assist/support. No further or follow up OT indicated at this time    OT Assessment  Patient does not need any further OT services    Follow Up Recommendations  No OT follow up;Supervision/Assistance - 24 hour;Supervision - Intermittent    Barriers to Discharge      Equipment Recommendations  3 in 1 bedside comode    Recommendations for Other Services    Frequency       Precautions / Restrictions Precautions Precautions: Fall Restrictions Weight Bearing Restrictions: Yes RLE Weight Bearing: Touchdown weight bearing   Pertinent Vitals/Pain 3/10  R LE    ADL  Grooming: Performed;Wash/dry hands;Wash/dry face;Supervision/safety;Set up Where Assessed - Grooming: Unsupported sitting Upper Body Bathing: Simulated;Supervision/safety;Set up Lower Body Bathing: Simulated;Moderate assistance Upper Body Dressing: Performed;Supervision/safety;Set up Where Assessed - Upper Body Dressing: Unsupported sitting Lower Body Dressing: Performed;Moderate assistance Toilet Transfer: Performed;Min guard Toilet Transfer Method: Sit to stand;Stand pivot Acupuncturist: Bedside commode Toileting - Clothing Manipulation and Hygiene: Performed;Moderate assistance Where Assessed - Toileting Clothing Manipulation and Hygiene: Standing Tub/Shower Transfer Method: Not assessed Equipment Used: Rolling walker;Other (comment) (BSC) Transfers/Ambulation Related to ADLs: from recliner to [facility], used RW fro safety. Good technique ADL Comments: Pt familiar with ADL A/E, provided with  education for LB dressing with reacher and bathing with LH sponge. Has reacher at home    OT Diagnosis:    OT Problem List:   OT Treatment Interventions:     OT Goals(Current goals can be found in the care plan section) Acute Rehab OT Goals Patient Stated Goal: I want to go back to work  Visit Information  Last OT Received On: 05/11/13 Assistance Needed: +1 History of Present Illness: R achilles tendon repair       Prior Functioning     Home Living Family/patient expects to be discharged to:: Private residence Living Arrangements: Spouse/significant other;Children Available Help at Discharge: Family Type of Home: House Home Access: Stairs to enter Secretary/administrator of Steps: 5 Entrance Stairs-Rails: Left;Right Home Layout: One level Home Equipment: Environmental consultant - 4 wheels;Walker - 2 wheels;Bedside commode;Crutches Additional Comments: has been walking on Cam boot  with weight on RLE Prior Function Level of Independence: Needs assistance Gait / Transfers Assistance Needed: steps Communication Communication: No difficulties Dominant Hand: Right         Vision/Perception Vision - History Baseline Vision: No visual deficits Patient Visual Report: No change from baseline Perception Perception: Within Functional Limits   Cognition  Cognition Arousal/Alertness: Awake/alert Behavior During Therapy: WFL for tasks assessed/performed Overall Cognitive Status: Within Functional Limits for tasks assessed    Extremity/Trunk Assessment Upper Extremity Assessment Upper Extremity Assessment: Overall WFL for tasks assessed Lower Extremity Assessment Lower Extremity Assessment: Defer to PT evaluation Cervical / Trunk Assessment Cervical / Trunk Assessment: Normal     Mobility Bed Mobility Bed Mobility: Not assessed Supine to Sit: 6: Modified independent (Device/Increase time) Details for Bed Mobility Assistance: seated in recliner Transfers Transfers: Sit to  Stand;Stand to Sit Sit to Stand: From chair/3-in-1;4: Min guard Stand to Sit: 4: Min guard;To chair/3-in-1 Details for Transfer  Assistance: from recliner to [facility], used RW fro safety. Good technique     Exercise     Balance Balance Balance Assessed: Yes Static Sitting Balance Static Sitting - Balance Support: No upper extremity supported;Feet supported Static Sitting - Level of Assistance: 7: Independent Dynamic Sitting Balance Dynamic Sitting - Balance Support: No upper extremity supported;Feet unsupported;During functional activity Dynamic Sitting - Level of Assistance: 6: Modified independent (Device/Increase time) Static Standing Balance Static Standing - Balance Support: Right upper extremity supported;Left upper extremity supported;Bilateral upper extremity supported;During functional activity Static Standing - Level of Assistance: 4: Min assist;5: Stand by assistance Static Standing - Comment/# of Minutes: with knee walker. Dynamic Standing Balance Dynamic Standing - Balance Support: Right upper extremity supported;Left upper extremity supported;Bilateral upper extremity supported;During functional activity Dynamic Standing - Level of Assistance: 4: Min assist   End of Session OT - End of Session Equipment Utilized During Treatment: Rolling walker;Other (comment) (BSC, reacher) Activity Tolerance: Patient tolerated treatment well Patient left: in chair;with call bell/phone within reach;with family/visitor present  GO     Galen Manila 05/11/2013, 1:00 PM

## 2013-05-11 NOTE — Discharge Summary (Signed)
Patient ID: Erin Vaughn MRN: [id number] DOB/AGE: 1971-04-19 42 y.o.  Admit date: 05/10/2013 Discharge date: 05/11/2013  Admission Diagnoses:  Principal Problem:   Rupture of right Achilles tendon Active Problems:   Achilles tendon tear   Discharge Diagnoses:  Same  Past Medical History  Diagnosis Date  . Anxiety   . Depression   . GERD (gastroesophageal reflux disease)   . Achilles tendon rupture   . Complication of anesthesia 2001    woke up during gallbladder surgery    Surgeries: Procedure(s): RIGHT ACHILLES TENDON REPAIR on 05/10/2013   Consultants:  none  Discharged Condition: Improved  Hospital Course: Erin Vaughn is an 42 y.o. female who was admitted 05/10/2013 for operative treatment ofRupture of right Achilles tendon. Patient has severe unremitting pain that affects sleep, daily activities, and work/hobbies. After pre-op clearance the patient was taken to the operating room on 05/10/2013 and underwent  Procedure(s): RIGHT ACHILLES TENDON REPAIR.    Patient was given perioperative antibiotics: Anti-infectives   Start     Dose/Rate Route Frequency Ordered Stop   05/10/13 2200  clindamycin (CLEOCIN) IVPB 900 mg     900 mg 100 mL/hr over 30 Minutes Intravenous Every 6 hours 05/10/13 1851 05/11/13 0939   05/10/13 1736  polymyxin B 500,000 Units, bacitracin 50,000 Units in sodium chloride irrigation 0.9 % 500 mL irrigation  Status:  Discontinued       As needed 05/10/13 1737 05/10/13 1754   05/10/13 1300  clindamycin (CLEOCIN) IVPB 900 mg     900 mg 100 mL/hr over 30 Minutes Intravenous  Once 05/10/13 1239 05/10/13 1630       Patient was given sequential compression devices, early ambulation, and chemoprophylaxis to prevent DVT. She was placed in a SLC, NWB, slight plantar flexion post-op. This was bivalved prior to her D/C. She participated in PT and did well.  Patient benefited maximally from hospital stay and there were no complications.    Recent  vital signs: Patient Vitals for the past 24 hrs:  BP Temp Temp src Pulse Resp SpO2 Height Weight  05/11/13 1012 106/60 mmHg 97.8 F (36.6 C) - 82 16 94 % - -  05/11/13 0557 123/75 mmHg 97.4 F (36.3 C) Oral 72 14 99 % - -  05/11/13 0155 125/74 mmHg 97.9 F (36.6 C) Oral 76 14 100 % - -  05/10/13 2221 145/81 mmHg 97.7 F (36.5 C) Oral 82 16 98 % - -  05/10/13 2128 120/75 mmHg 97.9 F (36.6 C) Oral 73 16 99 % - -  05/10/13 1956 125/79 mmHg 97.7 F (36.5 C) Axillary 69 14 96 % - -  05/10/13 1845 133/83 mmHg - Oral 82 16 100 % 5' 3.75" (1.619 m) 122.925 kg (271 lb)  05/10/13 1830 - 98.3 F (36.8 C) - - - - - -  05/10/13 1815 147/62 mmHg - - - - - - -  05/10/13 1800 - 98.1 F (36.7 C) - - - - - -     Recent laboratory studies:  Recent Labs  05/10/13 2046  WBC 16.9*  HGB 12.1  HCT 36.7  PLT 268  CREATININE 0.65     Discharge Medications:     Medication List    STOP taking these medications       chlorhexidine 4 % external liquid  Commonly known as:  HIBICLENS     HYDROcodone-acetaminophen 5-325 MG per tablet  Commonly known as:  NORCO/VICODIN     mupirocin ointment 2 %  Commonly known as:  BACTROBAN      TAKE these medications       buPROPion 200 MG 12 hr tablet  Commonly known as:  WELLBUTRIN SR  Take 200 mg by mouth 2 (two) times daily.     docusate sodium 100 MG capsule  Commonly known as:  COLACE  Take 1 capsule (100 mg total) by mouth 2 (two) times daily.     ibuprofen 800 MG tablet  Commonly known as:  ADVIL,MOTRIN  Take 1 tablet (800 mg total) by mouth 3 (three) times daily.     methocarbamol 500 MG tablet  Commonly known as:  ROBAXIN  Take 1 tablet (500 mg total) by mouth 3 (three) times daily between meals as needed for muscle spasms.     montelukast 10 MG tablet  Commonly known as:  SINGULAIR  Take 10 mg by mouth at bedtime.     multivitamin with minerals Tabs tablet  Take 1 tablet by mouth daily.     norethindrone 0.35 MG tablet   Commonly known as:  MICRONOR,CAMILA,ERRIN  Take 1 tablet by mouth every evening.     oxyCODONE-acetaminophen 7.5-325 MG per tablet  Commonly known as:  PERCOCET  Take 1-2 tablets by mouth every 4 (four) hours as needed for pain.     promethazine 12.5 MG tablet  Commonly known as:  PHENERGAN  Take 1 tablet (12.5 mg total) by mouth every 6 (six) hours as needed for nausea or vomiting.     ranitidine 300 MG tablet  Commonly known as:  ZANTAC  Take 300 mg by mouth daily as needed for heartburn.     Vitamin D (Ergocalciferol) 50000 UNITS Caps capsule  Commonly known as:  DRISDOL  Take 50,000 Units by mouth every 7 (seven) days.        Diagnostic Studies: Dg Ankle Complete Right  05/01/2013   CLINICAL DATA:  Fall.  Ankle injury.  Ankle pain and swelling.  EXAM: RIGHT ANKLE - COMPLETE 3+ VIEW  COMPARISON:  None.  FINDINGS: No evidence of fracture or dislocation. Mild degenerative spurring is seen involving the tibiotalar joint. No evidence of ankle joint effusion. No other bone lesion identified. Plantar and dorsal calcaneal spurs incidentally noted.  IMPRESSION: No acute findings.  Ankle joint osteoarthritis.   Electronically Signed   By: Myles Rosenthal M.D.   On: 05/01/2013 15:54    Disposition: 01-Home or Self Care      Discharge Orders   Future Orders Complete By Expires   Call MD / Call 911  As directed    Comments:     If you experience chest pain or shortness of breath, CALL 911 and be transported to the hospital emergency room.  If you develope a fever above 101 F, pus (white drainage) or increased drainage or redness at the wound, or calf pain, call your surgeon's office.   Constipation Prevention  As directed    Comments:     Drink plenty of fluids.  Prune juice may be helpful.  You may use a stool softener, such as Colace (over the counter) 100 mg twice a day.  Use MiraLax (over the counter) for constipation as needed.   Diet - low sodium heart healthy  As directed     Increase activity slowly as tolerated  As directed       Follow-up Information   Follow up with BEANE,JEFFREY C, MD In 2 weeks.   Specialty:  Orthopedic Surgery   Contact information:  [address] [place] [address] [phone]        Signed: Dorothy Spark. 05/11/2013, 2:15 PM

## 2013-05-11 NOTE — Op Note (Signed)
NAMECHERYLLYNN, SARFF NO.:  [id number]  MEDICAL RECORD NO.:  [id number]  LOCATION:  1603                         FACILITY:  [facility]  PHYSICIAN:  Jene Every, M.D.    DATE OF BIRTH:  1971-02-05  DATE OF PROCEDURE:  05/10/2013 DATE OF DISCHARGE:                              OPERATIVE REPORT   PREOPERATIVE DIAGNOSIS:  Achilles tendon rupture of the right.  POSTOPERATIVE DIAGNOSIS:  Achilles tendon rupture of the right.  PROCEDURE PERFORMED:  Open repair of Achilles tendon rupture utilizing a FiberWire suture and PushLock anchors.  ANESTHESIA:  General.  ASSISTANT:  Lanna Poche, PA.  BRIEF HISTORY:  This is a 42 year old who had 2 separate injuries to the Achilles tendon, with a complete rupture, palpated just above the insertion of the calcaneus.  She had no plantar flexion.  Compression of her calf did not produce plantar flexion.  She was unable to rise in her toes.  She works on her feet all day.  We discussed operative versus nonoperative repair.  After the full discussion of the risks and benefits, we decided to proceed with open repair.  Risk and benefits discussed including bleeding, infection, damage to neurovascular structures, recurrent tear, DVT, PE, and anesthetic complications, etc.  TECHNIQUE:  With the patient in supine position, after induction of adequate general anesthesia and 900 clindamycin, she was placed prone on the [place] frame.  All bony prominences were well padded.  Right lower extremity was prepped and draped in usual sterile fashion.  First, a curvilinear incision based on the posterior aspect of the calf curving laterally as we approached the insertion of the calcaneus.  Just above the calcaneus, we dissected through the subcutaneous tissue. Electrocautery was utilized to achieve hemostasis.  Noted was tearing through the peritenon distally.  We used retractors laterally to protect the sural nerve.  The length of the  incision was approximately 15 cm. We noted significant tearing of the peritenon and the tendo-Achilles, a large majority of this was avulsed from the insertion at the calcaneus. There were uneven ends on both sides, we debrided the nonviable portion of the tendon, mobilized the tendon proximally.  There was some attachment still onto the calcaneus but the majority of it was ruptured off.  We decided that this would require delivering the majority of the tear down to the calcaneus.  We prepared a bed in the calcaneus tuberosity with a Beyer rongeur.  An awl was utilized to make a hole in the bone, 3.5 mm in diameter.  Then, using #2 FiberWire and then threaded it up through the tendon, 2 separate sutures were utilized using a baseball stitch, cephalad and then caudad and 2 separate sutures.  The tendon was then mobilized to insert on the calcaneus in the neutral to slightly plantar flexed position.  We then placed both of the suture ends into PushLocks after tapping the PushLock into the bone and inserting the threaded ends of the suture through the PushLock and tensioning it appropriately and delivered the tendon to the bone.  The remaining 20% of the tendon that was still attached to the bone was then weaved into the insertion repair for good coverage.  We  had debrided the end of the bone just prior to delivering that, however.  The wound was copiously irrigated.  We placed some relaxing incisions in the fascia proximally.  The wound was copiously irrigated.  Felt there was an excellent repair and coverage noted.  Foot was placed in a slightly plantar flexed position, we repaired the portion of the remaining peritenon but fair amount of that was unrepairable.  Therefore, repaired the subcutaneous with 2-0 and skin was reapproximated with staples. Wound was dressed sterilely, clean, placed in plantar flexion and short leg cast was applied.  The patient was placed supine on hospital bed,  extubated without difficulty, and transported to the recovery room in satisfactory condition.  The patient tolerated the procedure well.  No complications.  Assistant, Lanna Poche, [place].  Minimal blood loss.     Jene Every, M.D.     Cordelia Pen  D:  05/10/2013  T:  05/11/2013  Job:  [id number]

## 2013-05-11 NOTE — Progress Notes (Addendum)
Subjective: 1 Day Post-Op Procedure(s) (LRB): RIGHT ACHILLES TENDON REPAIR (Right) Patient reports pain as mild.  Pain well controlled. Nausea yesterday was not relieved by zofran and reglan but did resolve with phenergan. Doing well this AM, no complaints.   Objective: Vital signs in last 24 hours: Temp:  [97.4 F (36.3 C)-98.3 F (36.8 C)] 97.4 F (36.3 C) (12/23 0557) Pulse Rate:  [69-82] 72 (12/23 0557) Resp:  [14-16] 14 (12/23 0557) BP: (120-147)/(62-83) 123/75 mmHg (12/23 0557) SpO2:  [96 %-100 %] 99 % (12/23 0557) Weight:  [122.925 kg (271 lb)] 122.925 kg (271 lb) (12/22 1845)  Intake/Output from previous day: 12/22 0701 - 12/23 0700 In: 1555.8 [I.V.:1505.8; IV Piggyback:50] Out: 1400 [Urine:1400] Intake/Output this shift:     Recent Labs  05/10/13 2046  HGB 12.1    Recent Labs  05/10/13 2046  WBC 16.9*  RBC 4.05  HCT 36.7  PLT 268    Recent Labs  05/10/13 2046  CREATININE 0.65   No results found for this basename: LABPT, INR,  in the last 72 hours  Neurologically intact ABD soft Neurovascular intact Sensation intact distally Intact pulses distally Dorsiflexion/Plantar flexion intact No cellulitis present Compartment soft no sign of DVT. Cast clean and dry  Assessment/Plan: 1 Day Post-Op Procedure(s) (LRB): RIGHT ACHILLES TENDON REPAIR (Right) Advance diet Up with therapy D/C IV fluids Remain NWB RLE Ice and elevation Plan D/C after PT today Pt has Rx at home for kneeling rolling scooter, is working on finding this as she has trouble with crutches Will add phenergan to D/C meds in case of nausea with percocet ASA for DVT ppx Will discuss with Dr. Elissa Lovett, JACLYN M. 05/11/2013, 8:40 AM

## 2013-05-11 NOTE — Progress Notes (Signed)
Pt to d/c home. AVS reviewed and "My Chart" discussed with pt. Pt capable of verbalizing medications, dressing changes, signs and symptoms of infection, and follow-up appointments. Remains hemodynamically stable. No signs and symptoms of distress. Educated pt to return to ER in the case of SOB, dizziness, or chest pain.  

## 2013-05-11 NOTE — Evaluation (Signed)
Physical Therapy Evaluation Patient Details Name: Erin Vaughn MRN: [id number] DOB: Feb 13, 1971 Today's Date: 05/11/2013 Time: 4098-1191 PT Time Calculation (min): 39 min  PT Assessment / Plan / Recommendation History of Present Illness  R achilles tendon repair  Clinical Impression  Pt beginning to learn use of Knee walker with safety tips.  Pt was gradually able to ambulate in hall. Pt's son locating a knee walker as not available through hospital, Pt will benefit from PT to address problems listed below.    PT Assessment  Patient needs continued PT services    Follow Up Recommendations  Home health PT;No PT follow up    Does the patient have the potential to tolerate intense rehabilitation      Barriers to Discharge        Equipment Recommendations   (knee walker.)    Recommendations for Other Services     Frequency 7X/week    Precautions / Restrictions Precautions Precautions: Fall Restrictions Weight Bearing Restrictions: Yes RLE Weight Bearing: Touchdown weight bearing   Pertinent Vitals/Pain 3 -4.      Mobility  Bed Mobility Bed Mobility: Not assessed Supine to Sit: 6: Modified independent (Device/Increase time) Details for Bed Mobility Assistance: seated in recliner Transfers Transfers: Sit to Stand;Stand to Sit;Stand Pivot Transfers Sit to Stand: From chair/3-in-1;4: Min guard Stand to Sit: 4: Min guard;To chair/3-in-1 Stand Pivot Transfers: 4: Min assist Details for Transfer Assistance: from recliner to [facility], used RW fro safety. Good technique Ambulation/Gait Ambulation/Gait Assistance: 4: Min assist Ambulation Distance (Feet): 60 Feet Assistive device:  (knee walker.) Ambulation/Gait Assistance Details: pt encouraged to slow down, mpt will do better with a shoe on L foot to raise her up. cues for safe turns and proper use of Knee walker. Gait Pattern: Step-to pattern    Exercises     PT Diagnosis: Difficulty walking;Acute pain  PT Problem  List: Decreased strength;Decreased activity tolerance;Decreased balance;Decreased mobility;Pain;Decreased knowledge of use of DME PT Treatment Interventions: DME instruction;Gait training;Stair training;Functional mobility training;Therapeutic activities;Therapeutic exercise;Patient/family education     PT Goals(Current goals can be found in the care plan section) Acute Rehab PT Goals Patient Stated Goal: I want to go back to work PT Goal Formulation: With patient Time For Goal Achievement: 05/13/13 Potential to Achieve Goals: Good  Visit Information  Last PT Received On: 05/11/13 Assistance Needed: +1 History of Present Illness: R achilles tendon repair       Prior Functioning  Home Living Family/patient expects to be discharged to:: Private residence Living Arrangements: Spouse/significant other;Children Available Help at Discharge: Family Type of Home: House Home Access: Stairs to enter Secretary/administrator of Steps: 5 Entrance Stairs-Rails: Left;Right Home Layout: One level Home Equipment: Environmental consultant - 4 wheels;Walker - 2 wheels;Bedside commode;Crutches Additional Comments: has been walking on Cam boot  with weight on RLE Prior Function Level of Independence: Needs assistance Gait / Transfers Assistance Needed: steps Communication Communication: No difficulties Dominant Hand: Right    Cognition  Cognition Arousal/Alertness: Awake/alert Behavior During Therapy: WFL for tasks assessed/performed Overall Cognitive Status: Within Functional Limits for tasks assessed    Extremity/Trunk Assessment Upper Extremity Assessment Upper Extremity Assessment: Overall WFL for tasks assessed Lower Extremity Assessment Lower Extremity Assessment: Defer to PT evaluation RLE Deficits / Details: able to lift from bed. Cervical / Trunk Assessment Cervical / Trunk Assessment: Normal   Balance Balance Balance Assessed: Yes Static Sitting Balance Static Sitting - Balance Support: No  upper extremity supported;Feet supported Static Sitting - Level of Assistance: 7: Independent  Dynamic Sitting Balance Dynamic Sitting - Balance Support: No upper extremity supported;Feet unsupported;During functional activity Dynamic Sitting - Level of Assistance: 6: Modified independent (Device/Increase time) Static Standing Balance Static Standing - Balance Support: Right upper extremity supported;Left upper extremity supported;Bilateral upper extremity supported;During functional activity Static Standing - Level of Assistance: 4: Min assist;5: Stand by assistance Static Standing - Comment/# of Minutes: with knee walker. Dynamic Standing Balance Dynamic Standing - Balance Support: Right upper extremity supported;Left upper extremity supported;Bilateral upper extremity supported;During functional activity Dynamic Standing - Level of Assistance: 4: Min assist  End of Session PT - End of Session Equipment Utilized During Treatment: Gait belt Activity Tolerance: Patient tolerated treatment well Patient left: in chair;with call bell/phone within reach Nurse Communication: Mobility status  GP Functional Assessment Tool Used: clinial judgement. Functional Limitation: Mobility: Walking and moving around Mobility: Walking and Moving Around Current Status [phone]): At least 20 percent but less than 40 percent impaired, limited or restricted Mobility: Walking and Moving Around Goal Status [phone]): At least 1 percent but less than 20 percent impaired, limited or restricted   Rada Hay 05/11/2013, 1:00 PM Blanchard Kelch PT [phone]

## 2014-03-21 ENCOUNTER — Encounter (HOSPITAL_COMMUNITY): Payer: Self-pay | Admitting: Specialist

## 2014-06-06 ENCOUNTER — Institutional Professional Consult (permissible substitution): Payer: Self-pay | Admitting: Neurology

## 2014-09-11 ENCOUNTER — Encounter (HOSPITAL_COMMUNITY): Payer: Self-pay | Admitting: Emergency Medicine

## 2014-09-11 ENCOUNTER — Emergency Department (HOSPITAL_COMMUNITY)
Admission: EM | Admit: 2014-09-11 | Discharge: 2014-09-11 | Disposition: A | Discharge status: Home / Self Care | Payer: 59 | Attending: Emergency Medicine | Admitting: Emergency Medicine

## 2014-09-11 DIAGNOSIS — L259 Unspecified contact dermatitis, unspecified cause: Secondary | ICD-10-CM | POA: Diagnosis not present

## 2014-09-11 DIAGNOSIS — Z87828 Personal history of other (healed) physical injury and trauma: Secondary | ICD-10-CM | POA: Diagnosis not present

## 2014-09-11 DIAGNOSIS — F419 Anxiety disorder, unspecified: Secondary | ICD-10-CM | POA: Insufficient documentation

## 2014-09-11 DIAGNOSIS — Z720 Tobacco use: Secondary | ICD-10-CM | POA: Insufficient documentation

## 2014-09-11 DIAGNOSIS — R0602 Shortness of breath: Secondary | ICD-10-CM | POA: Diagnosis not present

## 2014-09-11 DIAGNOSIS — L309 Dermatitis, unspecified: Secondary | ICD-10-CM

## 2014-09-11 DIAGNOSIS — Z79899 Other long term (current) drug therapy: Secondary | ICD-10-CM | POA: Insufficient documentation

## 2014-09-11 DIAGNOSIS — F329 Major depressive disorder, single episode, unspecified: Secondary | ICD-10-CM | POA: Insufficient documentation

## 2014-09-11 DIAGNOSIS — K219 Gastro-esophageal reflux disease without esophagitis: Secondary | ICD-10-CM | POA: Diagnosis not present

## 2014-09-11 DIAGNOSIS — R21 Rash and other nonspecific skin eruption: Secondary | ICD-10-CM | POA: Diagnosis present

## 2014-09-11 MED ORDER — DEXAMETHASONE SODIUM PHOSPHATE 4 MG/ML IJ SOLN
10.0000 mg | Freq: Once | INTRAMUSCULAR | Status: AC
  Administered 2014-09-11: 10 mg via INTRAMUSCULAR
  Filled 2014-09-11: qty 3

## 2014-09-11 MED ORDER — HYDROXYZINE HCL 25 MG PO TABS
50.0000 mg | ORAL_TABLET | Freq: Once | ORAL | Status: AC
  Administered 2014-09-11: 50 mg via ORAL
  Filled 2014-09-11: qty 2

## 2014-09-11 MED ORDER — HYDROXYZINE HCL 25 MG PO TABS: 25.0000 mg | ORAL_TABLET | Freq: Four times a day (QID) | ORAL | Status: DC | PRN

## 2014-09-11 MED ORDER — PREDNISONE 10 MG PO TABS: ORAL_TABLET | ORAL | Status: DC

## 2014-09-11 NOTE — ED Provider Notes (Signed)
CSN: [id number]     Arrival date & time 09/11/14  1505 History  This chart was scribed for non-physician practitioner Evalee Jefferson, PA-C working with Noemi Chapel, MD by Zola Button, ED Scribe. This patient was seen in room APFT21/APFT21 and the patient's care was started at 11:18 AM.     Chief Complaint  Patient presents with  . Rash   The history is provided by the patient. No language interpreter was used.   HPI Comments: Erin Vaughn is a 44 y.o. female who presents to the Emergency Department complaining of gradual onset, progressively worsening pruritic and burning rash including the top of her feet, her hands, the back of her neck, her back and her sides, that started 4-5 days ago. The itchiness is worse at night. Patient notes that she felt swelling to the joints of her hands and elbows earlier this morning, although she does not currently feel swelling.  Patient tried 2 Benadryl 2 nights ago, but was only able to obtain relief for 10 minutes. She denies sick contacts, new laundry detergents or lotions, new clothing, new sheets, new towels, scented shampoo use, recent outdoor work, and new medications or foods. Patient has dogs that live outside with minimal contact. She notes she has had sensitive skin since birth. She reports having mild chronic SOB, but she attributes this to her sinuses and smoking, and is not worsened from her baseline.  PCP: Dr. 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  Past Medical History  Diagnosis Date  . Anxiety   . Depression   . GERD (gastroesophageal reflux disease)   . Achilles tendon rupture   . Complication of anesthesia 2001    woke up during gallbladder surgery   Past Surgical History  Procedure Laterality Date  . Cholecystectomy  2001  . Achilles tendon surgery Right 05/10/2013    Procedure: RIGHT ACHILLES TENDON REPAIR;  Surgeon: Johnn Hai, MD;  Location: [facility];  Service: Orthopedics;  Laterality: Right;  achilles tendon    Family History  Problem Relation Age of Onset  . Cancer Mother   . COPD Other   . Hypertension Other   . Asthma Other    History  Substance Use Topics  . Smoking status: Current Every Day Smoker -- 0.50 packs/day for 15 years    Types: Cigarettes  . Smokeless tobacco: Never Used  . Alcohol Use: No   OB History    Gravida Para Term Preterm AB TAB SAB Ectopic Multiple Living   4 1 1  3  3   1      Review of Systems  Constitutional: Negative for fever.  HENT: Negative for sore throat.   Eyes: Negative.   Respiratory: Positive for shortness of breath. Negative for chest tightness.   Cardiovascular: Negative for chest pain.  Gastrointestinal: Negative for nausea and abdominal pain.  Genitourinary: Negative.   Musculoskeletal: Positive for joint swelling. Negative for arthralgias and neck pain.  Skin: Positive for rash. Negative for wound.  Neurological: Negative for dizziness, weakness, light-headedness, numbness and headaches.  Psychiatric/Behavioral: Negative.       Allergies  Vancomycin; Zithromax; and Adhesive  Home Medications   Prior to Admission medications   Medication Sig Start Date End Date Taking? Authorizing Provider  buPROPion (WELLBUTRIN SR) 200 MG 12 hr tablet Take 200 mg by mouth 2 (two) times daily.    Historical Provider, MD  docusate sodium (COLACE) 100 MG capsule Take 1 capsule (100 mg total) by mouth 2 (  two) times daily. 05/10/13   Susa Day, MD  hydrOXYzine (ATARAX/VISTARIL) 25 MG tablet Take 1 tablet (25 mg total) by mouth every 6 (six) hours as needed for itching. 09/11/14   Evalee Jefferson, PA-C  ibuprofen (ADVIL,MOTRIN) 800 MG tablet Take 1 tablet (800 mg total) by mouth 3 (three) times daily. 05/01/13   Ezequiel Essex, MD  methocarbamol (ROBAXIN) 500 MG tablet Take 1 tablet (500 mg total) by mouth 3 (three) times daily between meals as needed for muscle spasms. 05/10/13   Susa Day, MD  montelukast (SINGULAIR) 10 MG tablet Take 10 mg by mouth  at bedtime.    Historical Provider, MD  Multiple Vitamin (MULTIVITAMIN WITH MINERALS) TABS tablet Take 1 tablet by mouth daily.    Historical Provider, MD  norethindrone (MICRONOR,CAMILA,ERRIN) 0.35 MG tablet Take 1 tablet by mouth every evening.     Historical Provider, MD  oxyCODONE-acetaminophen (PERCOCET) 7.5-325 MG per tablet Take 1-2 tablets by mouth every 4 (four) hours as needed for pain. 05/10/13   Susa Day, MD  predniSONE (DELTASONE) 10 MG tablet 6, 5, 4, 3, 2 then 1 tablet by mouth daily for 6 days total. 09/11/14   Evalee Jefferson, PA-C  promethazine (PHENERGAN) 12.5 MG tablet Take 1 tablet (12.5 mg total) by mouth every 6 (six) hours as needed for nausea or vomiting. 05/11/13   Cecilie Kicks, PA-C  ranitidine (ZANTAC) 300 MG tablet Take 300 mg by mouth daily as needed for heartburn.    Historical Provider, MD  Vitamin D, Ergocalciferol, (DRISDOL) 50000 UNITS CAPS capsule Take 50,000 Units by mouth every 7 (seven) days.     Historical Provider, MD   BP 134/52 mmHg  Pulse 70  Temp(Src) 98.2 F (36.8 C) (Oral)  Resp 14  Ht 5\' 4"  (1.626 m)  Wt 287 lb (130.182 kg)  BMI 49.24 kg/m2  SpO2 100%  LMP 08/28/2014 Physical Exam  Constitutional: She appears well-developed and well-nourished.  HENT:  Head: Normocephalic and atraumatic.  Eyes: Conjunctivae are normal.  Neck: Normal range of motion.  Cardiovascular: Normal rate.   Pulmonary/Chest: Effort normal and breath sounds normal. No stridor. No respiratory distress. She has no wheezes.  Musculoskeletal: Normal range of motion. She exhibits no edema or tenderness.  Neurological: She is alert.  Skin: Skin is warm and dry. Rash noted. No erythema.  Scattered small raised papules, left ankle, lower back, neck. No surrounding erythema or edema. No drainage. She is very excoriated at these sites with dry appearing skin.  Psychiatric: She has a normal mood and affect.  Nursing note and vitals reviewed.   ED Course  Procedures   DIAGNOSTIC STUDIES: Oxygen Saturation is 100% on room air, normal by my interpretation.    COORDINATION OF CARE: 11:31 AM-Discussed treatment plan which includes steroids with pt at bedside and pt agreed to plan. Patient is not driving.  Labs Review Labs Reviewed - No data to display  Imaging Review No results found.   EKG Interpretation None      MDM   Final diagnoses:  Dermatitis    Suspect atopic vs contact dermatitis.  No findings suggesting infection, doubt scabies, scattered rash sites, not c/w shingles.  She was placed on prednisone, atarax in place of benadryl.  F/u with pcp if sx not improved with tx.  I personally performed the services described in this documentation, which was scribed in my presence. The recorded information has been reviewed and is accurate.   Evalee Jefferson, PA-C 09/12/14 [place],  MD 09/15/14 [phone]

## 2014-09-11 NOTE — Discharge Instructions (Signed)

## 2014-09-11 NOTE — ED Notes (Signed)
Patient with no complaints at this time. Respirations even and unlabored. Skin warm/dry. Discharge instructions reviewed with patient at this time. Patient given opportunity to voice concerns/ask questions. Patient discharged at this time and left Emergency Department with steady gait.   

## 2014-09-11 NOTE — ED Notes (Signed)
Pt reports generalized rash for last several days. Pt reports itching more intense at night.

## 2015-01-28 ENCOUNTER — Emergency Department (HOSPITAL_COMMUNITY)
Admission: EM | Admit: 2015-01-28 | Discharge: 2015-01-28 | Disposition: A | Discharge status: Home / Self Care | Payer: 59 | Attending: Emergency Medicine | Admitting: Emergency Medicine

## 2015-01-28 ENCOUNTER — Encounter (HOSPITAL_COMMUNITY): Payer: Self-pay | Admitting: Emergency Medicine

## 2015-01-28 DIAGNOSIS — K529 Noninfective gastroenteritis and colitis, unspecified: Secondary | ICD-10-CM | POA: Insufficient documentation

## 2015-01-28 DIAGNOSIS — Z880 Allergy status to penicillin: Secondary | ICD-10-CM | POA: Diagnosis not present

## 2015-01-28 DIAGNOSIS — B349 Viral infection, unspecified: Secondary | ICD-10-CM | POA: Diagnosis not present

## 2015-01-28 DIAGNOSIS — F419 Anxiety disorder, unspecified: Secondary | ICD-10-CM | POA: Diagnosis not present

## 2015-01-28 DIAGNOSIS — Z720 Tobacco use: Secondary | ICD-10-CM | POA: Insufficient documentation

## 2015-01-28 DIAGNOSIS — Z791 Long term (current) use of non-steroidal anti-inflammatories (NSAID): Secondary | ICD-10-CM | POA: Diagnosis not present

## 2015-01-28 DIAGNOSIS — Z79899 Other long term (current) drug therapy: Secondary | ICD-10-CM | POA: Insufficient documentation

## 2015-01-28 DIAGNOSIS — R109 Unspecified abdominal pain: Secondary | ICD-10-CM | POA: Diagnosis present

## 2015-01-28 DIAGNOSIS — Z87828 Personal history of other (healed) physical injury and trauma: Secondary | ICD-10-CM | POA: Insufficient documentation

## 2015-01-28 DIAGNOSIS — R21 Rash and other nonspecific skin eruption: Secondary | ICD-10-CM | POA: Insufficient documentation

## 2015-01-28 DIAGNOSIS — F329 Major depressive disorder, single episode, unspecified: Secondary | ICD-10-CM | POA: Diagnosis not present

## 2015-01-28 LAB — CBC WITH DIFFERENTIAL/PLATELET
Basophils Absolute: 0 10*3/uL (ref 0.0–0.1)
Basophils Relative: 0 % (ref 0–1)
EOS ABS: 0.1 10*3/uL (ref 0.0–0.7)
Eosinophils Relative: 1 % (ref 0–5)
HCT: 39.7 % (ref 36.0–46.0)
HEMOGLOBIN: 13.2 g/dL (ref 12.0–15.0)
LYMPHS ABS: 1.7 10*3/uL (ref 0.7–4.0)
LYMPHS PCT: 24 % (ref 12–46)
MCH: 30.3 pg (ref 26.0–34.0)
MCHC: 33.2 g/dL (ref 30.0–36.0)
MCV: 91.1 fL (ref 78.0–100.0)
Monocytes Absolute: 0.6 10*3/uL (ref 0.1–1.0)
Monocytes Relative: 9 % (ref 3–12)
NEUTROS PCT: 66 % (ref 43–77)
Neutro Abs: 4.5 10*3/uL (ref 1.7–7.7)
Platelets: 248 10*3/uL (ref 150–400)
RBC: 4.36 MIL/uL (ref 3.87–5.11)
RDW: 14 % (ref 11.5–15.5)
WBC: 7 10*3/uL (ref 4.0–10.5)

## 2015-01-28 LAB — COMPREHENSIVE METABOLIC PANEL
ALT: 38 U/L (ref 14–54)
ANION GAP: 9 (ref 5–15)
AST: 24 U/L (ref 15–41)
Albumin: 3.7 g/dL (ref 3.5–5.0)
Alkaline Phosphatase: 105 U/L (ref 38–126)
BUN: 7 mg/dL (ref 6–20)
CALCIUM: 8.5 mg/dL — AB (ref 8.9–10.3)
CO2: 25 mmol/L (ref 22–32)
Chloride: 104 mmol/L (ref 101–111)
Creatinine, Ser: 0.5 mg/dL (ref 0.44–1.00)
GFR calc non Af Amer: >60 mL/min (ref >60)
Glucose, Bld: 96 mg/dL (ref 65–99)
POTASSIUM: 3.7 mmol/L (ref 3.5–5.1)
Sodium: 138 mmol/L (ref 135–145)
Total Bilirubin: 0.4 mg/dL (ref 0.3–1.2)
Total Protein: 7 g/dL (ref 6.5–8.1)

## 2015-01-28 LAB — LIPASE, BLOOD: Lipase: <10 U/L — ABNORMAL LOW (ref 22–51)

## 2015-01-28 MED ORDER — PROMETHAZINE HCL 25 MG PO TABS: 25.0000 mg | ORAL_TABLET | Freq: Four times a day (QID) | ORAL | Status: DC | PRN

## 2015-01-28 MED ORDER — ONDANSETRON HCL 4 MG/2ML IJ SOLN
4.0000 mg | Freq: Once | INTRAMUSCULAR | Status: AC
  Administered 2015-01-28: 4 mg via INTRAVENOUS
  Filled 2015-01-28: qty 2

## 2015-01-28 MED ORDER — SODIUM CHLORIDE 0.9 % IV BOLUS (SEPSIS)
1000.0000 mL | Freq: Once | INTRAVENOUS | Status: AC
  Administered 2015-01-28: 1000 mL via INTRAVENOUS

## 2015-01-28 MED ORDER — DOXYCYCLINE HYCLATE 100 MG PO CAPS: 100.0000 mg | ORAL_CAPSULE | Freq: Two times a day (BID) | ORAL | Status: DC

## 2015-01-28 MED ORDER — PROMETHAZINE HCL 25 MG/ML IJ SOLN
12.5000 mg | Freq: Once | INTRAMUSCULAR | Status: AC
Start: 2015-01-28 — End: 2015-01-28
  Administered 2015-01-28: 12.5 mg via INTRAVENOUS
  Filled 2015-01-28: qty 1

## 2015-01-28 MED ORDER — KETOROLAC TROMETHAMINE 30 MG/ML IJ SOLN
30.0000 mg | Freq: Once | INTRAMUSCULAR | Status: AC
  Administered 2015-01-28: 30 mg via INTRAVENOUS
  Filled 2015-01-28: qty 1

## 2015-01-28 MED ORDER — NAPROXEN 500 MG PO TABS: 500.0000 mg | ORAL_TABLET | Freq: Two times a day (BID) | ORAL | Status: DC

## 2015-01-28 MED ORDER — SODIUM CHLORIDE 0.9 % IV SOLN: INTRAVENOUS | Status: DC

## 2015-01-28 MED ORDER — LOPERAMIDE HCL 2 MG PO TABS: 2.0000 mg | ORAL_TABLET | Freq: Four times a day (QID) | ORAL | Status: DC | PRN

## 2015-01-28 MED ORDER — DIPHENHYDRAMINE HCL 50 MG/ML IJ SOLN
25.0000 mg | Freq: Once | INTRAMUSCULAR | Status: AC
  Administered 2015-01-28: 25 mg via INTRAVENOUS
  Filled 2015-01-28: qty 1

## 2015-01-28 NOTE — ED Provider Notes (Signed)
CSN: [id number]     Arrival date & time 01/28/15  1624 History   First MD Initiated Contact with Patient 01/28/15 1652     Chief Complaint  Patient presents with  . Abdominal Pain     (Consider location/radiation/quality/duration/timing/severity/associated sxs/prior Treatment) Patient is a 44 y.o. female presenting with abdominal pain. The history is provided by the patient.  Abdominal Pain Associated symptoms: cough, diarrhea, fatigue, fever, nausea and vomiting   Associated symptoms: no chest pain, no dysuria and no shortness of breath    patient with several day history of vomiting and diarrhea. Patient had several ticks removed on Monday on the lower extremities and in the groin area. On Tuesday started with body aches. On Wednesday started with vomiting and diarrhea no blood in either. On Thursday started with fever up to 102 vomiting persisted. Also develop sinus pressure and started with a cough. The diarrhea continued. Today of vomiting has improved but nausea has continued and diarrhea has persisted. Associated with crampy abdominal pain. Donald pain is 5 out of 10. Generalized throughout the abdomen.   Past Medical History  Diagnosis Date  . Anxiety   . Depression   . GERD (gastroesophageal reflux disease)   . Achilles tendon rupture   . Complication of anesthesia 2001    woke up during gallbladder surgery   Past Surgical History  Procedure Laterality Date  . Cholecystectomy  2001  . Achilles tendon surgery Right 05/10/2013    Procedure: RIGHT ACHILLES TENDON REPAIR;  Surgeon: Johnn Hai, MD;  Location: [facility];  Service: Orthopedics;  Laterality: Right;  achilles tendon   Family History  Problem Relation Age of Onset  . Cancer Mother   . COPD Other   . Hypertension Other   . Asthma Other    Social History  Substance Use Topics  . Smoking status: Current Every Day Smoker -- 0.50 packs/day for 15 years    Types: Cigarettes  . Smokeless tobacco: Never Used  .  Alcohol Use: No   OB History    Gravida Para Term Preterm AB TAB SAB Ectopic Multiple Living   4 1 1  3  3   1      Review of Systems  Constitutional: Positive for fever and fatigue.  HENT: Positive for sinus pressure.   Eyes: Negative for visual disturbance.  Respiratory: Positive for cough. Negative for shortness of breath.   Cardiovascular: Negative for chest pain.  Gastrointestinal: Positive for nausea, vomiting, abdominal pain and diarrhea. Negative for blood in stool and abdominal distention.  Genitourinary: Negative for dysuria.  Musculoskeletal: Positive for myalgias.  Skin: Negative for rash.  Neurological: Positive for headaches.  Hematological: Does not bruise/bleed easily.  Psychiatric/Behavioral: Negative for confusion.      Allergies  Penicillins; Zithromax; Vancomycin; and Adhesive  Home Medications   Prior to Admission medications   Medication Sig Start Date End Date Taking? Authorizing Provider  acetaminophen (TYLENOL) 650 MG CR tablet Take 2,600 mg by mouth every 8 (eight) hours as needed for pain.   Yes Historical Provider, MD  aspirin-sod bicarb-citric acid (ALKA-SELTZER) 325 MG TBEF tablet Take 650 mg by mouth every 6 (six) hours as needed.   Yes Historical Provider, MD  cholecalciferol (VITAMIN D) 1000 UNITS tablet Take 1,000 Units by mouth daily.   Yes Historical Provider, MD  Cyanocobalamin (VITAMIN B 12 PO) Take 1 tablet by mouth daily.   Yes Historical Provider, MD  escitalopram (LEXAPRO) 20 MG tablet Take 20 mg by mouth daily.  Yes Historical Provider, MD  ibuprofen (ADVIL,MOTRIN) 200 MG tablet Take 800 mg by mouth every 6 (six) hours as needed for mild pain.   Yes Historical Provider, MD  losartan (COZAAR) 50 MG tablet Take 50 mg by mouth daily.   Yes Historical Provider, MD  montelukast (SINGULAIR) 10 MG tablet Take 10 mg by mouth at bedtime.   Yes Historical Provider, MD  Nutritional Supplements (ESTROVEN PO) Take 1 tablet by mouth daily.   Yes  Historical Provider, MD  docusate sodium (COLACE) 100 MG capsule Take 1 capsule (100 mg total) by mouth 2 (two) times daily. 05/10/13   Susa Day, MD  hydrOXYzine (ATARAX/VISTARIL) 25 MG tablet Take 1 tablet (25 mg total) by mouth every 6 (six) hours as needed for itching. 09/11/14   Evalee Jefferson, PA-C  ibuprofen (ADVIL,MOTRIN) 800 MG tablet Take 1 tablet (800 mg total) by mouth 3 (three) times daily. 05/01/13   Ezequiel Essex, MD  methocarbamol (ROBAXIN) 500 MG tablet Take 1 tablet (500 mg total) by mouth 3 (three) times daily between meals as needed for muscle spasms. 05/10/13   Susa Day, MD  oxyCODONE-acetaminophen (PERCOCET) 7.5-325 MG per tablet Take 1-2 tablets by mouth every 4 (four) hours as needed for pain. 05/10/13   Susa Day, MD  predniSONE (DELTASONE) 10 MG tablet 6, 5, 4, 3, 2 then 1 tablet by mouth daily for 6 days total. 09/11/14   Evalee Jefferson, PA-C  promethazine (PHENERGAN) 12.5 MG tablet Take 1 tablet (12.5 mg total) by mouth every 6 (six) hours as needed for nausea or vomiting. 05/11/13   Cecilie Kicks, PA-C   BP 170/65 mmHg  Pulse 74  Temp(Src) 98.1 F (36.7 C) (Oral)  Resp 16  Ht 5\' 4"  (1.626 m)  Wt 285 lb (129.275 kg)  BMI 48.90 kg/m2  SpO2 100%  LMP 01/13/2015 Physical Exam  Constitutional: She is oriented to person, place, and time. She appears well-developed and well-nourished. No distress.  HENT:  Head: Normocephalic and atraumatic.  Mucous membranes slightly dry.  Eyes: Conjunctivae and EOM are normal. Pupils are equal, round, and reactive to light.  Neck: Normal range of motion.  Cardiovascular: Normal rate, regular rhythm and normal heart sounds.   No murmur heard. Pulmonary/Chest: Effort normal and breath sounds normal. No respiratory distress.  Abdominal: Soft. Bowel sounds are normal. There is no tenderness.  Musculoskeletal: Normal range of motion. She exhibits no edema.  Neurological: She is alert and oriented to person, place, and time. No  cranial nerve deficit. She exhibits normal muscle tone. Coordination normal.  Skin: Skin is warm. Rash noted.  Evidence of multiple bug bites to both lower extremities. No active tics addendum 5. No evidence secondary infection.  Nursing note and vitals reviewed.   ED Course  Procedures (including critical care time) Labs Review Labs Reviewed  COMPREHENSIVE METABOLIC PANEL - Abnormal; Notable for the following:    Calcium 8.5 (*)    All other components within normal limits  LIPASE, BLOOD - Abnormal; Notable for the following:    Lipase <10 (*)    All other components within normal limits  CBC WITH DIFFERENTIAL/PLATELET   Results for orders placed or performed during the hospital encounter of 01/28/15  Comprehensive metabolic panel  Result Value Ref Range   Sodium 138 135 - 145 mmol/L   Potassium 3.7 3.5 - 5.1 mmol/L   Chloride 104 101 - 111 mmol/L   CO2 25 22 - 32 mmol/L   Glucose, Bld 96 65 -  99 mg/dL   BUN 7 6 - 20 mg/dL   Creatinine, Ser 0.50 0.44 - 1.00 mg/dL   Calcium 8.5 (L) 8.9 - 10.3 mg/dL   Total Protein 7.0 6.5 - 8.1 g/dL   Albumin 3.7 3.5 - 5.0 g/dL   AST 24 15 - 41 U/L   ALT 38 14 - 54 U/L   Alkaline Phosphatase 105 38 - 126 U/L   Total Bilirubin 0.4 0.3 - 1.2 mg/dL   GFR calc non Af Amer >60 >60 mL/min   GFR calc Af Amer >60 >60 mL/min   Anion gap 9 5 - 15  Lipase, blood  Result Value Ref Range   Lipase <10 (L) 22 - 51 U/L  CBC with Differential/Platelet  Result Value Ref Range   WBC 7.0 4.0 - 10.5 K/uL   RBC 4.36 3.87 - 5.11 MIL/uL   Hemoglobin 13.2 12.0 - 15.0 g/dL   HCT 39.7 36.0 - 46.0 %   MCV 91.1 78.0 - 100.0 fL   MCH 30.3 26.0 - 34.0 pg   MCHC 33.2 30.0 - 36.0 g/dL   RDW 14.0 11.5 - 15.5 %   Platelets 248 150 - 400 K/uL   Neutrophils Relative % 66 43 - 77 %   Neutro Abs 4.5 1.7 - 7.7 K/uL   Lymphocytes Relative 24 12 - 46 %   Lymphs Abs 1.7 0.7 - 4.0 K/uL   Monocytes Relative 9 3 - 12 %   Monocytes Absolute 0.6 0.1 - 1.0 K/uL    Eosinophils Relative 1 0 - 5 %   Eosinophils Absolute 0.1 0.0 - 0.7 K/uL   Basophils Relative 0 0 - 1 %   Basophils Absolute 0.0 0.0 - 0.1 K/uL     Imaging Review No results found. I have personally reviewed and evaluated these images and lab results as part of my medical decision-making.   EKG Interpretation None      MDM   Final diagnoses:  Gastroenteritis  Viral illness    Patient with the onset of bodyaches on Tuesday. Followed by diarrhea and vomiting on Wednesday. On Thursday had a fever to 102 and persistent vomiting. Diarrhea has continued. Also on Thursday developed some sinus pressure started with a cough. Along with the bodyaches his had a mild headache. Patient has not had any blood in the vomit or the diarrhea. Diarrhea is watery. Patient has not had any sick exposures.  Lab workup here shows no evidence of leukocytosis. Electrolytes including liver function test and lipase are all normal. Based on the constellation of symptoms is sounds like a viral illness with the a persistent gastroenteritis. It is also possible that this could've represented food poisoning but unlikely. Patient has no evidence of meningitis no fever here. Patient is nontoxic no acute distress. Abdomen has some slight generalized tenderness but no guarding no concerns for any acute abdominal process.  Patient showed signs of improvement with IV fluids here Phenergan and Benadryl which helped the headache. Also abdominal discomfort has improved. Patient has received IV fluid hydration. Patient will have toward all and Zofran added to the regimen.  Patient will be discharged home continuing with Imodium right ear and Zofran. Work note will be provided.  In addition on Monday patient had multiple tick exposure which have been removed. Predominantly to her legs and in the groin area. Do not feel that this is tickborne illness due to the onset the next day however will treat with doxycycline on an  outpatient basis.  Fredia Sorrow, MD 01/28/15 2000

## 2015-01-28 NOTE — ED Notes (Signed)
Patient acting very strangely with very inappropriate affect, patient loudly laughing, giggling and making irrelevant and inappropriate statements. Patient also asking for Phenergan by name and stating "that always makes me feel good".

## 2015-01-28 NOTE — ED Notes (Signed)
Abdominal pain, vomiting, diarrhea that she can not control, cough, headache

## 2015-01-28 NOTE — Discharge Instructions (Signed)
Take the Imodium right ear for the diarrhea. Take the Phenergan for the nausea and vomiting. Take the Naprosyn for the bodyaches. Return for any new or worse symptoms. Work note provided to be out of work until Tuesday. Take the doxycycline as directed for the possibility of the tickborne illness.

## 2015-01-28 NOTE — ED Notes (Signed)
Pt states she pulled 15 seed ticks off her on Monday

## 2015-08-27 ENCOUNTER — Encounter (HOSPITAL_COMMUNITY): Payer: Self-pay | Admitting: Emergency Medicine

## 2015-08-27 ENCOUNTER — Emergency Department (HOSPITAL_COMMUNITY)
Admission: EM | Admit: 2015-08-27 | Discharge: 2015-08-28 | Disposition: A | Discharge status: Home / Self Care | Payer: BLUE CROSS/BLUE SHIELD | Attending: Emergency Medicine | Admitting: Emergency Medicine

## 2015-08-27 DIAGNOSIS — G43909 Migraine, unspecified, not intractable, without status migrainosus: Secondary | ICD-10-CM

## 2015-08-27 DIAGNOSIS — R51 Headache: Secondary | ICD-10-CM | POA: Diagnosis not present

## 2015-08-27 DIAGNOSIS — G43009 Migraine without aura, not intractable, without status migrainosus: Secondary | ICD-10-CM | POA: Diagnosis not present

## 2015-08-27 DIAGNOSIS — Z79899 Other long term (current) drug therapy: Secondary | ICD-10-CM | POA: Insufficient documentation

## 2015-08-27 DIAGNOSIS — Z7982 Long term (current) use of aspirin: Secondary | ICD-10-CM | POA: Insufficient documentation

## 2015-08-27 DIAGNOSIS — R112 Nausea with vomiting, unspecified: Secondary | ICD-10-CM | POA: Insufficient documentation

## 2015-08-27 DIAGNOSIS — F329 Major depressive disorder, single episode, unspecified: Secondary | ICD-10-CM | POA: Insufficient documentation

## 2015-08-27 DIAGNOSIS — F17210 Nicotine dependence, cigarettes, uncomplicated: Secondary | ICD-10-CM | POA: Diagnosis not present

## 2015-08-27 HISTORY — DX: Migraine, unspecified, not intractable, without status migrainosus: G43.909

## 2015-08-27 LAB — CBC WITH DIFFERENTIAL/PLATELET
BASOS PCT: 0 %
Basophils Absolute: 0 10*3/uL (ref 0.0–0.1)
EOS PCT: 2 %
Eosinophils Absolute: 0.3 10*3/uL (ref 0.0–0.7)
HCT: 39.9 % (ref 36.0–46.0)
Hemoglobin: 13.4 g/dL (ref 12.0–15.0)
Lymphocytes Relative: 13 %
Lymphs Abs: 2.2 10*3/uL (ref 0.7–4.0)
MCH: 30.7 pg (ref 26.0–34.0)
MCHC: 33.6 g/dL (ref 30.0–36.0)
MCV: 91.3 fL (ref 78.0–100.0)
MONOS PCT: 7 %
Monocytes Absolute: 1.2 10*3/uL — ABNORMAL HIGH (ref 0.1–1.0)
NEUTROS ABS: 13.4 10*3/uL — AB (ref 1.7–7.7)
Neutrophils Relative %: 78 %
Platelets: 290 10*3/uL (ref 150–400)
RBC: 4.37 MIL/uL (ref 3.87–5.11)
RDW: 13.7 % (ref 11.5–15.5)
WBC: 17.1 10*3/uL — ABNORMAL HIGH (ref 4.0–10.5)

## 2015-08-27 MED ORDER — DIPHENHYDRAMINE HCL 50 MG/ML IJ SOLN
25.0000 mg | Freq: Once | INTRAMUSCULAR | Status: AC
  Administered 2015-08-27: 25 mg via INTRAVENOUS
  Filled 2015-08-27: qty 1

## 2015-08-27 MED ORDER — METOCLOPRAMIDE HCL 5 MG/ML IJ SOLN
10.0000 mg | Freq: Once | INTRAMUSCULAR | Status: AC
  Administered 2015-08-27: 10 mg via INTRAVENOUS
  Filled 2015-08-27: qty 2

## 2015-08-27 MED ORDER — SODIUM CHLORIDE 0.9 % IV BOLUS (SEPSIS)
1000.0000 mL | Freq: Once | INTRAVENOUS | Status: AC
  Administered 2015-08-27: 1000 mL via INTRAVENOUS

## 2015-08-27 MED ORDER — KETOROLAC TROMETHAMINE 30 MG/ML IJ SOLN
30.0000 mg | Freq: Once | INTRAMUSCULAR | Status: AC
  Administered 2015-08-27: 30 mg via INTRAVENOUS
  Filled 2015-08-27: qty 1

## 2015-08-27 NOTE — ED Provider Notes (Signed)
CSN: [id number]     Arrival date & time 08/27/15  2126 History   First MD Initiated Contact with Patient 08/27/15 2205     Chief Complaint  Patient presents with  . Headache     (Consider location/radiation/quality/duration/timing/severity/associated sxs/prior Treatment) HPI   Erin Vaughn is a 45 y.o. female who presents to the Emergency Department complaining of diffuse, throbbing headache that began this morning.  She reports waking up with the headache and it has gradually worsened throughout the day.  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  She states that she began vomiting this evening and upper chest pain that started approximately one hour prior to ED arrival.  Headache is worsened with sound and light and nothing has made it better.  She also reports having facial pressure.  She denies fever, shortness of breath, numbness or weakness, diaphoresis.  Headache is similar to previous migraines.    Past Medical History  Diagnosis Date  . Anxiety   . Depression   . GERD (gastroesophageal reflux disease)   . Achilles tendon rupture   . Complication of anesthesia 2001    woke up during gallbladder surgery  . Migraine    Past Surgical History  Procedure Laterality Date  . Cholecystectomy  2001  . Achilles tendon surgery Right 05/10/2013    Procedure: RIGHT ACHILLES TENDON REPAIR;  Surgeon: Johnn Hai, MD;  Location: [facility];  Service: Orthopedics;  Laterality: Right;  achilles tendon   Family History  Problem Relation Age of Onset  . Cancer Mother   . COPD Other   . Hypertension Other   . Asthma Other    Social History  Substance Use Topics  . Smoking status: Current Every Day Smoker -- 0.50 packs/day for 15 years    Types: Cigarettes  . Smokeless tobacco: Never Used  . Alcohol Use: No   OB History    Gravida Para Term Preterm AB TAB SAB Ectopic Multiple Living   4 1 1  3  3   1      Review of Systems  Constitutional: Negative for fever,  chills, activity change and appetite change.  HENT: Positive for sinus pressure. Negative for congestion, ear pain, facial swelling and trouble swallowing.   Eyes: Positive for photophobia. Negative for pain and visual disturbance.  Respiratory: Negative for chest tightness and shortness of breath.   Cardiovascular: Positive for chest pain.  Gastrointestinal: Positive for nausea and vomiting. Negative for abdominal pain.  Genitourinary: Negative for dysuria.  Musculoskeletal: Negative for neck pain and neck stiffness.  Skin: Negative for rash and wound.  Neurological: Positive for headaches. Negative for dizziness, syncope, facial asymmetry, speech difficulty, weakness and numbness.  Psychiatric/Behavioral: Negative for confusion and decreased concentration.  All other systems reviewed and are negative.     Allergies  Penicillins; Zithromax; Vancomycin; and Adhesive  Home Medications   Prior to Admission medications   Medication Sig Start Date End Date Taking? Authorizing Provider  Aspirin-Caffeine (BAYER BACK & BODY PAIN EX ST) 500-32.5 MG TABS Take 1-2 tablets by mouth daily as needed (for pain).   Yes Historical Provider, MD  losartan (COZAAR) 50 MG tablet Take 50 mg by mouth daily.   Yes Historical Provider, MD  montelukast (SINGULAIR) 10 MG tablet Take 10 mg by mouth at bedtime.   Yes Historical Provider, MD  oxyCODONE-acetaminophen (PERCOCET/ROXICET) 5-325 MG tablet Take 1 tablet by mouth every 6 (six) hours as needed for moderate pain or severe pain.  Yes Historical Provider, MD  venlafaxine XR (EFFEXOR-XR) 150 MG 24 hr capsule Take 150 mg by mouth daily with breakfast.   Yes Historical Provider, MD   BP 165/75 mmHg  Pulse 90  Temp(Src) 98.4 F (36.9 C) (Oral)  Resp 24  Ht 5\' 4"  (1.626 m)  Wt 127.007 kg  BMI 48.04 kg/m2  SpO2 100%  LMP 08/06/2015 Physical Exam  Constitutional: She is oriented to person, place, and time. She appears well-developed and well-nourished. No  distress.  HENT:  Head: Normocephalic and atraumatic.  Mouth/Throat: Oropharynx is clear and moist.  Eyes: EOM are normal. Pupils are equal, round, and reactive to light.  Neck: Normal range of motion and phonation normal. Neck supple. No spinous process tenderness and no muscular tenderness present. No rigidity. No Brudzinski's sign and no Kernig's sign noted.  Cardiovascular: Normal rate, regular rhythm, normal heart sounds and intact distal pulses.   No murmur heard. Pulmonary/Chest: Effort normal and breath sounds normal. No respiratory distress.  Musculoskeletal: Normal range of motion.  Neurological: She is alert and oriented to person, place, and time. She has normal strength. No cranial nerve deficit or sensory deficit. She exhibits normal muscle tone. Coordination and gait normal. GCS eye subscore is 4. GCS verbal subscore is 5. GCS motor subscore is 6.  Reflex Scores:      Tricep reflexes are 2+ on the right side and 2+ on the left side.      Bicep reflexes are 2+ on the right side and 2+ on the left side. Skin: Skin is warm and dry.  Psychiatric: She has a normal mood and affect.  Nursing note and vitals reviewed.   ED Course  Procedures (including critical care time) Labs Review Labs Reviewed  CBC WITH DIFFERENTIAL/PLATELET - Abnormal; Notable for the following:    WBC 17.1 (*)    Neutro Abs 13.4 (*)    Monocytes Absolute 1.2 (*)    All other components within normal limits  BASIC METABOLIC PANEL - Abnormal; Notable for the following:    CO2 20 (*)    Glucose, Bld 108 (*)    Calcium 8.5 (*)    All other components within normal limits  TROPONIN I  POC URINE PREG, ED    Imaging Review No results found. I have personally reviewed and evaluated these images and lab results as part of my medical decision-making.    ED ECG REPORT   Date: 08/27/2015  Rate: 74  Rhythm: normal sinus rhythm  QRS Axis: normal  Intervals: normal  ST/T Wave abnormalities: normal   Conduction Disutrbances:none  Narrative Interpretation:   Old EKG Reviewed: improved from previous  EKG reviewed by Dr. Roderic Palau     MDM   Final diagnoses:  Migraine without status migrainosus, not intractable, unspecified migraine type   On recheck, pt is feeling better.  No nuchal rigidity.  Headache greatly improved and pt feels ready to be discharged.  Denies further chest pain.  Labs indicate a leukocytosis, but patient states that is normal for her and white count has been elevated in the past.  She denies any recent illness, fever, chills or other symptoms at present including dysuria.  This may be a stress reaction, I feel that she is stable for discharge and she agrees to arrange f/u with her PMD regarding this.  I have also advised her to return here for worsening sx's.       Kem Parkinson, PA-C 08/29/15 1241  Milton Ferguson, MD 08/29/15 1650

## 2015-08-27 NOTE — ED Notes (Addendum)
Patient complaining of pain to back, face, and head since awakening this morning. Patient also complaining of vomiting starting approximately 1 hour prior to arrival to ED. During triage patient complaining of chest pain. States it started approximately 1 hour ago.

## 2015-08-28 LAB — BASIC METABOLIC PANEL
ANION GAP: 11 (ref 5–15)
BUN: 10 mg/dL (ref 6–20)
CALCIUM: 8.5 mg/dL — AB (ref 8.9–10.3)
CO2: 20 mmol/L — AB (ref 22–32)
Chloride: 106 mmol/L (ref 101–111)
Creatinine, Ser: 0.52 mg/dL (ref 0.44–1.00)
GFR calc Af Amer: >60 mL/min (ref >60)
GFR calc non Af Amer: >60 mL/min (ref >60)
Glucose, Bld: 108 mg/dL — ABNORMAL HIGH (ref 65–99)
Potassium: 4.6 mmol/L (ref 3.5–5.1)
Sodium: 137 mmol/L (ref 135–145)

## 2015-08-28 LAB — POC URINE PREG, ED: PREG TEST UR: NEGATIVE

## 2015-08-28 LAB — TROPONIN I: Troponin I: <0.03 ng/mL (ref <0.031)

## 2015-08-28 NOTE — Discharge Instructions (Signed)
Migraine Headache  A migraine headache is very bad, throbbing pain on one or both sides of your head. Talk to your doctor about what things may bring on (trigger) your migraine headaches.  HOME CARE  · Only take medicines as told by your doctor.  · Lie down in a dark, quiet room when you have a migraine.  · Keep a journal to find out if certain things bring on migraine headaches. For example, write down:    What you eat and drink.    How much sleep you get.    Any change to your diet or medicines.  · Lessen how much alcohol you drink.  · Quit smoking if you smoke.  · Get enough sleep.  · Lessen any stress in your life.  · Keep lights dim if bright lights bother you or make your migraines worse.  GET HELP RIGHT AWAY IF:   · Your migraine becomes really bad.  · You have a fever.  · You have a stiff neck.  · You have trouble seeing.  · Your muscles are weak, or you lose muscle control.  · You lose your balance or have trouble walking.  · You feel like you will pass out (faint), or you pass out.  · You have really bad symptoms that are different than your first symptoms.  MAKE SURE YOU:   · Understand these instructions.  · Will watch your condition.  · Will get help right away if you are not doing well or get worse.     This information is not intended to replace advice given to you by your health care provider. Make sure you discuss any questions you have with your health care provider.     Document Released: 02/13/2008 Document Revised: 07/29/2011 Document Reviewed: 01/11/2013  Elsevier Interactive Patient Education ©2016 Elsevier Inc.

## 2015-08-29 DIAGNOSIS — G43011 Migraine without aura, intractable, with status migrainosus: Secondary | ICD-10-CM | POA: Diagnosis not present

## 2015-08-29 DIAGNOSIS — E6609 Other obesity due to excess calories: Secondary | ICD-10-CM | POA: Diagnosis not present

## 2015-08-29 DIAGNOSIS — I10 Essential (primary) hypertension: Secondary | ICD-10-CM | POA: Diagnosis not present

## 2015-08-29 DIAGNOSIS — R51 Headache: Secondary | ICD-10-CM | POA: Diagnosis not present

## 2015-08-29 DIAGNOSIS — M5430 Sciatica, unspecified side: Secondary | ICD-10-CM | POA: Diagnosis not present

## 2015-08-31 DIAGNOSIS — R51 Headache: Secondary | ICD-10-CM | POA: Diagnosis not present

## 2015-08-31 DIAGNOSIS — R110 Nausea: Secondary | ICD-10-CM | POA: Diagnosis not present

## 2015-08-31 DIAGNOSIS — I10 Essential (primary) hypertension: Secondary | ICD-10-CM | POA: Diagnosis not present

## 2015-08-31 DIAGNOSIS — D72829 Elevated white blood cell count, unspecified: Secondary | ICD-10-CM | POA: Diagnosis not present

## 2015-09-13 DIAGNOSIS — M47816 Spondylosis without myelopathy or radiculopathy, lumbar region: Secondary | ICD-10-CM | POA: Diagnosis not present

## 2015-09-26 DIAGNOSIS — M4316 Spondylolisthesis, lumbar region: Secondary | ICD-10-CM | POA: Diagnosis not present

## 2015-09-26 DIAGNOSIS — M4806 Spinal stenosis, lumbar region: Secondary | ICD-10-CM | POA: Diagnosis not present

## 2015-09-26 DIAGNOSIS — M545 Low back pain: Secondary | ICD-10-CM | POA: Diagnosis not present

## 2015-10-02 DIAGNOSIS — I10 Essential (primary) hypertension: Secondary | ICD-10-CM | POA: Diagnosis not present

## 2015-12-19 DIAGNOSIS — M47816 Spondylosis without myelopathy or radiculopathy, lumbar region: Secondary | ICD-10-CM | POA: Diagnosis not present

## 2015-12-23 DIAGNOSIS — M545 Low back pain: Secondary | ICD-10-CM | POA: Diagnosis not present

## 2015-12-23 DIAGNOSIS — J060 Acute laryngopharyngitis: Secondary | ICD-10-CM | POA: Diagnosis not present

## 2016-01-02 DIAGNOSIS — M4806 Spinal stenosis, lumbar region: Secondary | ICD-10-CM | POA: Diagnosis not present

## 2016-01-02 DIAGNOSIS — E782 Mixed hyperlipidemia: Secondary | ICD-10-CM | POA: Diagnosis not present

## 2016-01-02 DIAGNOSIS — M5416 Radiculopathy, lumbar region: Secondary | ICD-10-CM | POA: Diagnosis not present

## 2016-01-02 DIAGNOSIS — M47816 Spondylosis without myelopathy or radiculopathy, lumbar region: Secondary | ICD-10-CM | POA: Diagnosis not present

## 2016-01-02 DIAGNOSIS — M4316 Spondylolisthesis, lumbar region: Secondary | ICD-10-CM | POA: Diagnosis not present

## 2016-01-08 DIAGNOSIS — M545 Low back pain: Secondary | ICD-10-CM | POA: Diagnosis not present

## 2016-01-08 DIAGNOSIS — I10 Essential (primary) hypertension: Secondary | ICD-10-CM | POA: Diagnosis not present

## 2016-01-19 DIAGNOSIS — J060 Acute laryngopharyngitis: Secondary | ICD-10-CM | POA: Diagnosis not present

## 2016-01-19 DIAGNOSIS — J019 Acute sinusitis, unspecified: Secondary | ICD-10-CM | POA: Diagnosis not present

## 2016-01-19 DIAGNOSIS — R05 Cough: Secondary | ICD-10-CM | POA: Diagnosis not present

## 2016-02-28 DIAGNOSIS — Z6841 Body Mass Index (BMI) 40.0 and over, adult: Secondary | ICD-10-CM | POA: Diagnosis not present

## 2016-02-28 DIAGNOSIS — F411 Generalized anxiety disorder: Secondary | ICD-10-CM | POA: Diagnosis not present

## 2016-02-28 DIAGNOSIS — F339 Major depressive disorder, recurrent, unspecified: Secondary | ICD-10-CM | POA: Diagnosis not present

## 2016-04-23 DIAGNOSIS — M5136 Other intervertebral disc degeneration, lumbar region: Secondary | ICD-10-CM | POA: Diagnosis not present

## 2016-07-11 DIAGNOSIS — R7301 Impaired fasting glucose: Secondary | ICD-10-CM | POA: Diagnosis not present

## 2016-07-11 DIAGNOSIS — H9319 Tinnitus, unspecified ear: Secondary | ICD-10-CM | POA: Diagnosis not present

## 2016-07-11 DIAGNOSIS — H6980 Other specified disorders of Eustachian tube, unspecified ear: Secondary | ICD-10-CM | POA: Diagnosis not present

## 2016-07-11 DIAGNOSIS — E785 Hyperlipidemia, unspecified: Secondary | ICD-10-CM | POA: Diagnosis not present

## 2016-07-11 DIAGNOSIS — R51 Headache: Secondary | ICD-10-CM | POA: Diagnosis not present

## 2016-07-15 DIAGNOSIS — F411 Generalized anxiety disorder: Secondary | ICD-10-CM | POA: Diagnosis not present

## 2016-07-15 DIAGNOSIS — I10 Essential (primary) hypertension: Secondary | ICD-10-CM | POA: Diagnosis not present

## 2016-07-15 DIAGNOSIS — E785 Hyperlipidemia, unspecified: Secondary | ICD-10-CM | POA: Diagnosis not present

## 2016-07-15 DIAGNOSIS — Z0000 Encounter for general adult medical examination without abnormal findings: Secondary | ICD-10-CM | POA: Diagnosis not present

## 2016-08-09 DIAGNOSIS — M9903 Segmental and somatic dysfunction of lumbar region: Secondary | ICD-10-CM | POA: Diagnosis not present

## 2016-08-09 DIAGNOSIS — M9902 Segmental and somatic dysfunction of thoracic region: Secondary | ICD-10-CM | POA: Diagnosis not present

## 2016-08-09 DIAGNOSIS — M545 Low back pain: Secondary | ICD-10-CM | POA: Diagnosis not present

## 2016-08-09 DIAGNOSIS — M546 Pain in thoracic spine: Secondary | ICD-10-CM | POA: Diagnosis not present

## 2016-08-12 DIAGNOSIS — M9903 Segmental and somatic dysfunction of lumbar region: Secondary | ICD-10-CM | POA: Diagnosis not present

## 2016-08-12 DIAGNOSIS — M546 Pain in thoracic spine: Secondary | ICD-10-CM | POA: Diagnosis not present

## 2016-08-12 DIAGNOSIS — M545 Low back pain: Secondary | ICD-10-CM | POA: Diagnosis not present

## 2016-08-12 DIAGNOSIS — M9902 Segmental and somatic dysfunction of thoracic region: Secondary | ICD-10-CM | POA: Diagnosis not present

## 2016-08-14 DIAGNOSIS — M9902 Segmental and somatic dysfunction of thoracic region: Secondary | ICD-10-CM | POA: Diagnosis not present

## 2016-08-14 DIAGNOSIS — M9903 Segmental and somatic dysfunction of lumbar region: Secondary | ICD-10-CM | POA: Diagnosis not present

## 2016-08-14 DIAGNOSIS — M545 Low back pain: Secondary | ICD-10-CM | POA: Diagnosis not present

## 2016-08-14 DIAGNOSIS — M546 Pain in thoracic spine: Secondary | ICD-10-CM | POA: Diagnosis not present

## 2016-08-15 DIAGNOSIS — M9902 Segmental and somatic dysfunction of thoracic region: Secondary | ICD-10-CM | POA: Diagnosis not present

## 2016-08-15 DIAGNOSIS — M546 Pain in thoracic spine: Secondary | ICD-10-CM | POA: Diagnosis not present

## 2016-08-15 DIAGNOSIS — M545 Low back pain: Secondary | ICD-10-CM | POA: Diagnosis not present

## 2016-08-15 DIAGNOSIS — M9903 Segmental and somatic dysfunction of lumbar region: Secondary | ICD-10-CM | POA: Diagnosis not present

## 2016-08-19 DIAGNOSIS — M9902 Segmental and somatic dysfunction of thoracic region: Secondary | ICD-10-CM | POA: Diagnosis not present

## 2016-08-19 DIAGNOSIS — M545 Low back pain: Secondary | ICD-10-CM | POA: Diagnosis not present

## 2016-08-19 DIAGNOSIS — M9903 Segmental and somatic dysfunction of lumbar region: Secondary | ICD-10-CM | POA: Diagnosis not present

## 2016-08-19 DIAGNOSIS — M546 Pain in thoracic spine: Secondary | ICD-10-CM | POA: Diagnosis not present

## 2016-08-22 DIAGNOSIS — M546 Pain in thoracic spine: Secondary | ICD-10-CM | POA: Diagnosis not present

## 2016-08-22 DIAGNOSIS — M9902 Segmental and somatic dysfunction of thoracic region: Secondary | ICD-10-CM | POA: Diagnosis not present

## 2016-08-22 DIAGNOSIS — M545 Low back pain: Secondary | ICD-10-CM | POA: Diagnosis not present

## 2016-08-22 DIAGNOSIS — M9903 Segmental and somatic dysfunction of lumbar region: Secondary | ICD-10-CM | POA: Diagnosis not present

## 2016-08-28 DIAGNOSIS — M9903 Segmental and somatic dysfunction of lumbar region: Secondary | ICD-10-CM | POA: Diagnosis not present

## 2016-08-28 DIAGNOSIS — M546 Pain in thoracic spine: Secondary | ICD-10-CM | POA: Diagnosis not present

## 2016-08-28 DIAGNOSIS — M9902 Segmental and somatic dysfunction of thoracic region: Secondary | ICD-10-CM | POA: Diagnosis not present

## 2016-08-28 DIAGNOSIS — M545 Low back pain: Secondary | ICD-10-CM | POA: Diagnosis not present

## 2016-08-29 DIAGNOSIS — M9902 Segmental and somatic dysfunction of thoracic region: Secondary | ICD-10-CM | POA: Diagnosis not present

## 2016-08-29 DIAGNOSIS — M546 Pain in thoracic spine: Secondary | ICD-10-CM | POA: Diagnosis not present

## 2016-08-29 DIAGNOSIS — M545 Low back pain: Secondary | ICD-10-CM | POA: Diagnosis not present

## 2016-08-29 DIAGNOSIS — M9903 Segmental and somatic dysfunction of lumbar region: Secondary | ICD-10-CM | POA: Diagnosis not present

## 2016-09-04 DIAGNOSIS — M546 Pain in thoracic spine: Secondary | ICD-10-CM | POA: Diagnosis not present

## 2016-09-04 DIAGNOSIS — M9902 Segmental and somatic dysfunction of thoracic region: Secondary | ICD-10-CM | POA: Diagnosis not present

## 2016-09-04 DIAGNOSIS — M545 Low back pain: Secondary | ICD-10-CM | POA: Diagnosis not present

## 2016-09-04 DIAGNOSIS — M9903 Segmental and somatic dysfunction of lumbar region: Secondary | ICD-10-CM | POA: Diagnosis not present

## 2016-09-05 DIAGNOSIS — M545 Low back pain: Secondary | ICD-10-CM | POA: Diagnosis not present

## 2016-09-05 DIAGNOSIS — M546 Pain in thoracic spine: Secondary | ICD-10-CM | POA: Diagnosis not present

## 2016-09-05 DIAGNOSIS — M9903 Segmental and somatic dysfunction of lumbar region: Secondary | ICD-10-CM | POA: Diagnosis not present

## 2016-09-05 DIAGNOSIS — M9902 Segmental and somatic dysfunction of thoracic region: Secondary | ICD-10-CM | POA: Diagnosis not present

## 2016-09-10 DIAGNOSIS — M9903 Segmental and somatic dysfunction of lumbar region: Secondary | ICD-10-CM | POA: Diagnosis not present

## 2016-09-10 DIAGNOSIS — M545 Low back pain: Secondary | ICD-10-CM | POA: Diagnosis not present

## 2016-09-10 DIAGNOSIS — M9902 Segmental and somatic dysfunction of thoracic region: Secondary | ICD-10-CM | POA: Diagnosis not present

## 2016-09-10 DIAGNOSIS — M546 Pain in thoracic spine: Secondary | ICD-10-CM | POA: Diagnosis not present

## 2016-09-12 ENCOUNTER — Encounter (INDEPENDENT_AMBULATORY_CARE_PROVIDER_SITE_OTHER): Payer: Self-pay | Admitting: Orthopaedic Surgery

## 2016-09-12 ENCOUNTER — Other Ambulatory Visit (INDEPENDENT_AMBULATORY_CARE_PROVIDER_SITE_OTHER): Payer: Self-pay | Admitting: Orthopaedic Surgery

## 2016-09-12 ENCOUNTER — Ambulatory Visit (INDEPENDENT_AMBULATORY_CARE_PROVIDER_SITE_OTHER): Payer: BLUE CROSS/BLUE SHIELD | Admitting: Orthopaedic Surgery

## 2016-09-12 VITALS — BP 129/69 | HR 86 | Ht 64.0 in | Wt 250.0 lb

## 2016-09-12 DIAGNOSIS — M545 Low back pain, unspecified: Secondary | ICD-10-CM

## 2016-09-12 DIAGNOSIS — G8929 Other chronic pain: Secondary | ICD-10-CM

## 2016-09-12 DIAGNOSIS — M79605 Pain in left leg: Secondary | ICD-10-CM

## 2016-09-13 DIAGNOSIS — M545 Low back pain: Secondary | ICD-10-CM | POA: Diagnosis not present

## 2016-09-13 DIAGNOSIS — M546 Pain in thoracic spine: Secondary | ICD-10-CM | POA: Diagnosis not present

## 2016-09-13 DIAGNOSIS — M9903 Segmental and somatic dysfunction of lumbar region: Secondary | ICD-10-CM | POA: Diagnosis not present

## 2016-09-13 DIAGNOSIS — M9902 Segmental and somatic dysfunction of thoracic region: Secondary | ICD-10-CM | POA: Diagnosis not present

## 2016-09-14 NOTE — Progress Notes (Signed)
Office Visit Note   Patient: Erin Vaughn           Date of Birth: [birth year]           MRN: [id number] Visit Date: 09/12/2016              Requested by: Celene Squibb, MD [address] [place], [place] [address] PCP: Wende Neighbors, MD   Assessment & Plan: Visit Diagnoses:  1. Chronic bilateral low back pain, with sciatica presence unspecified     Plan: We'll proceed with the new MRI scan for comparison to last year. Office follow-up DEXA scan.  Follow-Up Instructions: No Follow-up on file.   Orders:  No orders of the defined types were placed in this encounter.  No orders of the defined types were placed in this encounter.     Procedures: No procedures performed   Clinical Data: No additional findings.   Subjective: Chief Complaint  Patient presents with  . Lower Back - Pain    HPI patient: One-year history of sharp back pain that radiates in her buttocks under her knees. She's been through chiropractic treatments, epidural steroid injections 3. She's been treated with the Lyrica bariatric corset belt, tramadol. She smokes one fourth pack per day and has been treated at [facility] and had MRI scan May 2007 ordered by Dr. Susa Day with findings of some minor facet arthrosis at L4-5 subtle 4 mm synovial cyst. Mild lateral recess and minimal foraminal stenosis at L4-5. L5-S1 had an annular tear with small distal disc extrusion minimally flattening the ventral thecal sac mild lateral recess stenosis. Patient's had persistent ongoing symptoms. Previous treatment has been unsuccessful significant improvement in her symptoms. Despite 85 pounds weight loss she's still having significant pain that she rates a moderate to severe. It bothers her with daily activities. Chiropractic pages of therapy treatments MRI scans patient progress notes and old MRI scan was reviewed with his visit and with the patient extensively.  Review of Systems 14 point review of systems  performed she's had previous Achilles tendon rupture with repair. Gallbladder surgery. She has some seasonal allergies, increased BMI on phentermine. She still taking Lyrica foramina all air back and body and blood pressure medication. Positive for migraines depression acid reflux anxiety bronchitis and hypertension. Patient has lost 85 pounds.   Objective: Vital Signs: BP 129/69   Pulse 86   Ht 5\' 4"  (1.626 m)   Wt 250 lb (113.4 kg)   BMI 42.91 kg/m   Physical Exam  Constitutional: She is oriented to person, place, and time. She appears well-developed.  HENT:  Head: Normocephalic.  Right Ear: External ear normal.  Left Ear: External ear normal.  Eyes: Pupils are equal, round, and reactive to light.  Neck: No tracheal deviation present. No thyromegaly present.  Cardiovascular: Normal rate.   Pulmonary/Chest: Effort normal.  Abdominal: Soft.  Neurological: She is alert and oriented to person, place, and time.  Skin: Skin is warm and dry.  Psychiatric: She has a normal mood and affect. Her behavior is normal.    Ortho Exam she has intact reflexes anterior tibia posterior to gastrocsoleus are strong. Positive straight leg raising at 80 right and left. No sensory deficit. Negative Faber test normal hip range of motion. Upper semi-reflexes lower extremity reflexes are 2+ and symmetrical. Knees reach full extension. Collateral and cruciate ligament exam is normal and symmetrical. Negative Homans. Distal pulses are 2+.  Specialty Comments:  No specialty comments available.  Imaging:  No results found.   PMFS History: Patient Active Problem List   Diagnosis Date Noted  . Rupture of right Achilles tendon 05/10/2013  . Achilles tendon tear 05/10/2013   Past Medical History:  Diagnosis Date  . Achilles tendon rupture   . Anxiety   . Complication of anesthesia 2001   woke up during gallbladder surgery  . Depression   . GERD (gastroesophageal reflux disease)   . Migraine       Family History  Problem Relation Age of Onset  . Cancer Mother   . COPD Other   . Hypertension Other   . Asthma Other     Past Surgical History:  Procedure Laterality Date  . ACHILLES TENDON SURGERY Right 05/10/2013   Procedure: RIGHT ACHILLES TENDON REPAIR;  Surgeon: Johnn Hai, MD;  Location: [facility];  Service: Orthopedics;  Laterality: Right;  achilles tendon  . CHOLECYSTECTOMY  2001   Social History   Occupational History  . Not on file.   Social History Main Topics  . Smoking status: Current Every Day Smoker    Packs/day: 0.50    Years: 15.00    Types: Cigarettes  . Smokeless tobacco: Never Used  . Alcohol use No  . Drug use: No  . Sexual activity: Yes    Birth control/ protection: Pill

## 2016-09-16 ENCOUNTER — Ambulatory Visit (HOSPITAL_COMMUNITY)
Admission: RE | Admit: 2016-09-16 | Discharge: 2016-09-16 | Disposition: A | Discharge status: Home / Self Care | Payer: BLUE CROSS/BLUE SHIELD | Source: Ambulatory Visit | Attending: Orthopaedic Surgery | Admitting: Orthopaedic Surgery

## 2016-09-16 DIAGNOSIS — M4316 Spondylolisthesis, lumbar region: Secondary | ICD-10-CM | POA: Insufficient documentation

## 2016-09-16 DIAGNOSIS — M5127 Other intervertebral disc displacement, lumbosacral region: Secondary | ICD-10-CM | POA: Insufficient documentation

## 2016-09-16 DIAGNOSIS — M5126 Other intervertebral disc displacement, lumbar region: Secondary | ICD-10-CM | POA: Diagnosis not present

## 2016-09-16 DIAGNOSIS — M48061 Spinal stenosis, lumbar region without neurogenic claudication: Secondary | ICD-10-CM | POA: Diagnosis not present

## 2016-09-16 DIAGNOSIS — M79605 Pain in left leg: Secondary | ICD-10-CM

## 2016-09-16 DIAGNOSIS — M545 Low back pain, unspecified: Secondary | ICD-10-CM

## 2016-09-17 DIAGNOSIS — M546 Pain in thoracic spine: Secondary | ICD-10-CM | POA: Diagnosis not present

## 2016-09-17 DIAGNOSIS — M9903 Segmental and somatic dysfunction of lumbar region: Secondary | ICD-10-CM | POA: Diagnosis not present

## 2016-09-17 DIAGNOSIS — M545 Low back pain: Secondary | ICD-10-CM | POA: Diagnosis not present

## 2016-09-17 DIAGNOSIS — M9902 Segmental and somatic dysfunction of thoracic region: Secondary | ICD-10-CM | POA: Diagnosis not present

## 2016-09-19 ENCOUNTER — Ambulatory Visit (INDEPENDENT_AMBULATORY_CARE_PROVIDER_SITE_OTHER): Payer: BLUE CROSS/BLUE SHIELD | Admitting: Orthopaedic Surgery

## 2016-09-19 DIAGNOSIS — M546 Pain in thoracic spine: Secondary | ICD-10-CM | POA: Diagnosis not present

## 2016-09-19 DIAGNOSIS — M9903 Segmental and somatic dysfunction of lumbar region: Secondary | ICD-10-CM | POA: Diagnosis not present

## 2016-09-19 DIAGNOSIS — M9902 Segmental and somatic dysfunction of thoracic region: Secondary | ICD-10-CM | POA: Diagnosis not present

## 2016-09-19 DIAGNOSIS — M545 Low back pain: Secondary | ICD-10-CM | POA: Diagnosis not present

## 2016-09-23 DIAGNOSIS — M47816 Spondylosis without myelopathy or radiculopathy, lumbar region: Secondary | ICD-10-CM | POA: Diagnosis not present

## 2016-09-26 DIAGNOSIS — M9903 Segmental and somatic dysfunction of lumbar region: Secondary | ICD-10-CM | POA: Diagnosis not present

## 2016-09-26 DIAGNOSIS — M546 Pain in thoracic spine: Secondary | ICD-10-CM | POA: Diagnosis not present

## 2016-09-26 DIAGNOSIS — M545 Low back pain: Secondary | ICD-10-CM | POA: Diagnosis not present

## 2016-09-26 DIAGNOSIS — M9902 Segmental and somatic dysfunction of thoracic region: Secondary | ICD-10-CM | POA: Diagnosis not present

## 2016-09-27 ENCOUNTER — Telehealth (INDEPENDENT_AMBULATORY_CARE_PROVIDER_SITE_OTHER): Payer: Self-pay | Admitting: Orthopaedic Surgery

## 2016-09-27 NOTE — Telephone Encounter (Signed)
09/12/2016 OV NOTE FAXED TO [facility] [phone]

## 2016-10-03 DIAGNOSIS — M9903 Segmental and somatic dysfunction of lumbar region: Secondary | ICD-10-CM | POA: Diagnosis not present

## 2016-10-03 DIAGNOSIS — M545 Low back pain: Secondary | ICD-10-CM | POA: Diagnosis not present

## 2016-10-03 DIAGNOSIS — M9902 Segmental and somatic dysfunction of thoracic region: Secondary | ICD-10-CM | POA: Diagnosis not present

## 2016-10-03 DIAGNOSIS — M546 Pain in thoracic spine: Secondary | ICD-10-CM | POA: Diagnosis not present

## 2016-10-03 NOTE — Progress Notes (Signed)
We don't have pts authorization to release info to [facility]. Unable to fax.

## 2016-10-04 DIAGNOSIS — M545 Low back pain: Secondary | ICD-10-CM | POA: Diagnosis not present

## 2016-10-04 DIAGNOSIS — M4316 Spondylolisthesis, lumbar region: Secondary | ICD-10-CM | POA: Diagnosis not present

## 2016-10-09 DIAGNOSIS — M47816 Spondylosis without myelopathy or radiculopathy, lumbar region: Secondary | ICD-10-CM | POA: Diagnosis not present

## 2016-10-09 DIAGNOSIS — M48061 Spinal stenosis, lumbar region without neurogenic claudication: Secondary | ICD-10-CM | POA: Diagnosis not present

## 2016-10-16 DIAGNOSIS — M545 Low back pain: Secondary | ICD-10-CM | POA: Diagnosis not present

## 2016-10-16 DIAGNOSIS — M4316 Spondylolisthesis, lumbar region: Secondary | ICD-10-CM | POA: Diagnosis not present

## 2016-10-21 DIAGNOSIS — M5442 Lumbago with sciatica, left side: Secondary | ICD-10-CM | POA: Diagnosis not present

## 2016-11-04 DIAGNOSIS — M47816 Spondylosis without myelopathy or radiculopathy, lumbar region: Secondary | ICD-10-CM | POA: Diagnosis not present

## 2016-11-04 DIAGNOSIS — M48061 Spinal stenosis, lumbar region without neurogenic claudication: Secondary | ICD-10-CM | POA: Diagnosis not present

## 2016-11-25 DIAGNOSIS — M5442 Lumbago with sciatica, left side: Secondary | ICD-10-CM | POA: Diagnosis not present

## 2016-11-25 DIAGNOSIS — M47816 Spondylosis without myelopathy or radiculopathy, lumbar region: Secondary | ICD-10-CM | POA: Diagnosis not present

## 2016-12-10 ENCOUNTER — Other Ambulatory Visit: Payer: Self-pay | Admitting: Neurological Surgery

## 2016-12-10 DIAGNOSIS — M48062 Spinal stenosis, lumbar region with neurogenic claudication: Secondary | ICD-10-CM | POA: Diagnosis not present

## 2016-12-10 DIAGNOSIS — M4316 Spondylolisthesis, lumbar region: Secondary | ICD-10-CM | POA: Diagnosis not present

## 2016-12-23 DIAGNOSIS — M546 Pain in thoracic spine: Secondary | ICD-10-CM | POA: Diagnosis not present

## 2016-12-23 DIAGNOSIS — M9901 Segmental and somatic dysfunction of cervical region: Secondary | ICD-10-CM | POA: Diagnosis not present

## 2016-12-23 DIAGNOSIS — M9902 Segmental and somatic dysfunction of thoracic region: Secondary | ICD-10-CM | POA: Diagnosis not present

## 2016-12-23 DIAGNOSIS — M542 Cervicalgia: Secondary | ICD-10-CM | POA: Diagnosis not present

## 2016-12-30 DIAGNOSIS — M546 Pain in thoracic spine: Secondary | ICD-10-CM | POA: Diagnosis not present

## 2016-12-30 DIAGNOSIS — M542 Cervicalgia: Secondary | ICD-10-CM | POA: Diagnosis not present

## 2016-12-30 DIAGNOSIS — M9901 Segmental and somatic dysfunction of cervical region: Secondary | ICD-10-CM | POA: Diagnosis not present

## 2016-12-30 DIAGNOSIS — M9902 Segmental and somatic dysfunction of thoracic region: Secondary | ICD-10-CM | POA: Diagnosis not present

## 2017-01-14 DIAGNOSIS — I10 Essential (primary) hypertension: Secondary | ICD-10-CM | POA: Diagnosis not present

## 2017-01-14 DIAGNOSIS — R7301 Impaired fasting glucose: Secondary | ICD-10-CM | POA: Diagnosis not present

## 2017-01-15 DIAGNOSIS — M9902 Segmental and somatic dysfunction of thoracic region: Secondary | ICD-10-CM | POA: Diagnosis not present

## 2017-01-15 DIAGNOSIS — M542 Cervicalgia: Secondary | ICD-10-CM | POA: Diagnosis not present

## 2017-01-15 DIAGNOSIS — M546 Pain in thoracic spine: Secondary | ICD-10-CM | POA: Diagnosis not present

## 2017-01-15 DIAGNOSIS — M9901 Segmental and somatic dysfunction of cervical region: Secondary | ICD-10-CM | POA: Diagnosis not present

## 2017-01-16 DIAGNOSIS — M545 Low back pain: Secondary | ICD-10-CM | POA: Diagnosis not present

## 2017-01-16 DIAGNOSIS — E785 Hyperlipidemia, unspecified: Secondary | ICD-10-CM | POA: Diagnosis not present

## 2017-01-16 DIAGNOSIS — J309 Allergic rhinitis, unspecified: Secondary | ICD-10-CM | POA: Diagnosis not present

## 2017-01-16 DIAGNOSIS — I10 Essential (primary) hypertension: Secondary | ICD-10-CM | POA: Diagnosis not present

## 2017-01-17 NOTE — Pre-Procedure Instructions (Signed)
CHELESA WEINGARTNER  01/17/2017      [facility], [place] - [address] [place] [address] Phone: [phone] Fax: [phone]  [place], [place] [address] Phone: [phone] Fax: [phone]    Your procedure is scheduled on January 22, 2017.  Report to [facility] Admitting at 630 AM.  Call this number if you have problems the morning of surgery:  [phone]   Remember:  Do not eat food or drink liquids after midnight.  Take these medicines the morning of surgery with A SIP OF WATER fluticasone (voltaren), gabapentin (neurontin), montelukast (singulair), pantoprazole (protonix), tizanidine (zanaflex).  7 days prior to surgery STOP taking any diclofenac (voltaren), Aspirin, Aleve, Naproxen, Ibuprofen, Motrin, Advil, Goody's, BC's, all herbal medications, fish oil, and all vitamins   Do not wear jewelry, make-up or nail polish.  Do not wear lotions, powders, or perfumes, or deoderant.  Do not shave 48 hours prior to surgery.    Do not bring valuables to the hospital.  [facility] is not responsible for any belongings or valuables.  Contacts, dentures or bridgework may not be worn into surgery.  Leave your suitcase in the car.  After surgery it may be brought to your room.  For patients admitted to the hospital, discharge time will be determined by your treatment team.  Patients discharged the day of surgery will not be allowed to drive home.    Special instructions:  [facility]- Preparing For Surgery  Before surgery, you can play an important role. Because skin is not sterile, your skin needs to be as free of germs as possible. You can reduce the number of germs on your skin by washing with CHG (chlorahexidine gluconate) Soap before surgery.  CHG is an antiseptic cleaner which kills germs and bonds with the skin to continue killing germs even  after washing.  Please do not use if you have an allergy to CHG or antibacterial soaps. If your skin becomes reddened/irritated stop using the CHG.  Do not shave (including legs and underarms) for at least 48 hours prior to first CHG shower. It is OK to shave your face.  Please follow these instructions carefully.   1. Shower the NIGHT BEFORE SURGERY and the MORNING OF SURGERY with CHG.   2. If you chose to wash your hair, wash your hair first as usual with your normal shampoo.  3. After you shampoo, rinse your hair and body thoroughly to remove the shampoo.  4. Use CHG as you would any other liquid soap. You can apply CHG directly to the skin and wash gently with a scrungie or a clean washcloth.   5. Apply the CHG Soap to your body ONLY FROM THE NECK DOWN.  Do not use on open wounds or open sores. Avoid contact with your eyes, ears, mouth and genitals (private parts). Wash genitals (private parts) with your normal soap.  6. Wash thoroughly, paying special attention to the area where your surgery will be performed.  7. Thoroughly rinse your body with warm water from the neck down.  8. DO NOT shower/wash with your normal soap after using and rinsing off the CHG Soap.  9. Pat yourself dry with a CLEAN TOWEL.   10. Wear CLEAN PAJAMAS   11. Place CLEAN SHEETS on your bed the night of your first shower and DO NOT SLEEP WITH PETS.  Day of Surgery: Do not apply any deodorants/lotions. Please wear clean clothes to the hospital/surgery center.     Please read over the following fact sheets that you were given. Pain Booklet, Coughing and Deep Breathing, MRSA Information and Surgical Site Infection Prevention

## 2017-01-21 ENCOUNTER — Encounter (HOSPITAL_COMMUNITY)
Admission: RE | Admit: 2017-01-21 | Discharge: 2017-01-21 | Disposition: A | Discharge status: Home / Self Care | Payer: BLUE CROSS/BLUE SHIELD | Source: Ambulatory Visit | Attending: Neurological Surgery | Admitting: Neurological Surgery

## 2017-01-21 ENCOUNTER — Ambulatory Visit (HOSPITAL_COMMUNITY)
Admission: RE | Admit: 2017-01-21 | Discharge: 2017-01-21 | Disposition: A | Discharge status: Home / Self Care | Payer: BLUE CROSS/BLUE SHIELD | Source: Ambulatory Visit | Attending: Neurological Surgery | Admitting: Neurological Surgery

## 2017-01-21 ENCOUNTER — Encounter (HOSPITAL_COMMUNITY): Payer: Self-pay

## 2017-01-21 ENCOUNTER — Encounter (HOSPITAL_COMMUNITY): Payer: Self-pay | Admitting: Emergency Medicine

## 2017-01-21 DIAGNOSIS — M4310 Spondylolisthesis, site unspecified: Secondary | ICD-10-CM | POA: Insufficient documentation

## 2017-01-21 DIAGNOSIS — Z01818 Encounter for other preprocedural examination: Secondary | ICD-10-CM | POA: Diagnosis not present

## 2017-01-21 DIAGNOSIS — Z6841 Body Mass Index (BMI) 40.0 and over, adult: Secondary | ICD-10-CM | POA: Diagnosis not present

## 2017-01-21 DIAGNOSIS — N611 Abscess of the breast and nipple: Secondary | ICD-10-CM | POA: Diagnosis not present

## 2017-01-21 DIAGNOSIS — R918 Other nonspecific abnormal finding of lung field: Secondary | ICD-10-CM | POA: Diagnosis not present

## 2017-01-21 HISTORY — DX: Personal history of Methicillin resistant Staphylococcus aureus infection: Z86.14

## 2017-01-21 HISTORY — DX: Unspecified asthma, uncomplicated: J45.909

## 2017-01-21 HISTORY — DX: Essential (primary) hypertension: I10

## 2017-01-21 LAB — SURGICAL PCR SCREEN
MRSA, PCR: NEGATIVE
STAPHYLOCOCCUS AUREUS: POSITIVE — AB

## 2017-01-21 NOTE — Progress Notes (Signed)
Called in prescription at [facility] [phone], patient verbalized understanding

## 2017-01-21 NOTE — Progress Notes (Signed)
Patient has an appointment at 2:40 today to get her right breast abscess looked at

## 2017-01-21 NOTE — Progress Notes (Addendum)
PCP - Wende Neighbors Cardiologist - denies  Chest x-ray - 01/21/17 EKG - 01/21/17 Stress Test - denies ECHO - denies Cardiac Cath - denies  Sleep Study - denies  Patient came in with an abscess on her right breast on the side. Erin Ivory, DNP to look at it.  It is red, swollen, with puss filled drainage.  Patient has has this for about a week.  Patient has not seen anyone about this abscess.  Patient stated that it is recurrent for her to have these types of spots on her.  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  Erin Vaughn stated that she will call Dr. Ronnald Ramp about abscess.  Erin Vaughn also instructed patient that she needs to call her PCP to have it looked at.  No labs today due to high chance that her surgery may be postponed.  If surgery is still planned for 9/5 then labs will need to be drawn the morning of surgery.       Patient denies shortness of breath, fever, cough and chest pain at PAT appointment   Patient verbalized understanding of instructions that were given to them at the PAT appointment. Patient was also instructed that they will need to review over the PAT instructions again at home before surgery.

## 2017-01-22 ENCOUNTER — Inpatient Hospital Stay (HOSPITAL_COMMUNITY)
Admission: RE | Admit: 2017-01-22 | Payer: BLUE CROSS/BLUE SHIELD | Source: Ambulatory Visit | Admitting: Neurological Surgery

## 2017-01-22 ENCOUNTER — Encounter (HOSPITAL_COMMUNITY): Admission: RE | Payer: Self-pay | Source: Ambulatory Visit

## 2017-01-22 SURGERY — POSTERIOR LUMBAR FUSION 1 LEVEL
Anesthesia: General | Site: Back

## 2017-01-28 DIAGNOSIS — Z6841 Body Mass Index (BMI) 40.0 and over, adult: Secondary | ICD-10-CM | POA: Diagnosis not present

## 2017-01-28 DIAGNOSIS — N611 Abscess of the breast and nipple: Secondary | ICD-10-CM | POA: Diagnosis not present

## 2017-01-28 DIAGNOSIS — B373 Candidiasis of vulva and vagina: Secondary | ICD-10-CM | POA: Diagnosis not present

## 2017-01-28 DIAGNOSIS — Z720 Tobacco use: Secondary | ICD-10-CM | POA: Diagnosis not present

## 2017-02-05 ENCOUNTER — Other Ambulatory Visit: Payer: Self-pay | Admitting: Neurological Surgery

## 2017-02-17 NOTE — Pre-Procedure Instructions (Signed)
    Erin Vaughn  02/17/2017       Your procedure is scheduled on 02/20/2017.  Report to [facility] Admitting at 9 A.M.  Call this number if you have problems the morning of surgery:  [phone]   Remember:  Do not eat food or drink liquids after midnight.  Take these medicines the morning of surgery with A SIP OF WATER: Wellbutrin, Gabapentin, Protonix, and Flonase.  Stop herbal medications, NSAIDS (Ibuprofen, voltaren) and phentermine as of today.   Do not wear jewelry, make-up or nail polish.  Do not wear lotions, powders, or perfumes, or deodorant.  Do not shave 48 hours prior to surgery.    [facility] is not responsible for any belongings or valuables.  Contacts, dentures or bridgework may not be worn into surgery.  Leave your suitcase in the car.  After surgery it may be brought to your room.  For patients admitted to the hospital, discharge time will be determined by your treatment team.  Patients discharged the day of surgery will not be allowed to drive home.   Special instructions:    Please read over the fact sheets that you were given.

## 2017-02-18 ENCOUNTER — Encounter (HOSPITAL_COMMUNITY)
Admission: RE | Admit: 2017-02-18 | Discharge: 2017-02-18 | Disposition: A | Discharge status: Home / Self Care | Payer: BLUE CROSS/BLUE SHIELD | Source: Ambulatory Visit | Attending: Neurological Surgery | Admitting: Neurological Surgery

## 2017-02-18 ENCOUNTER — Encounter (HOSPITAL_COMMUNITY): Payer: Self-pay

## 2017-02-18 DIAGNOSIS — F329 Major depressive disorder, single episode, unspecified: Secondary | ICD-10-CM | POA: Diagnosis not present

## 2017-02-18 DIAGNOSIS — F17210 Nicotine dependence, cigarettes, uncomplicated: Secondary | ICD-10-CM | POA: Diagnosis not present

## 2017-02-18 DIAGNOSIS — M48061 Spinal stenosis, lumbar region without neurogenic claudication: Secondary | ICD-10-CM | POA: Diagnosis not present

## 2017-02-18 DIAGNOSIS — F419 Anxiety disorder, unspecified: Secondary | ICD-10-CM | POA: Diagnosis not present

## 2017-02-18 DIAGNOSIS — Z91048 Other nonmedicinal substance allergy status: Secondary | ICD-10-CM | POA: Diagnosis not present

## 2017-02-18 DIAGNOSIS — Z825 Family history of asthma and other chronic lower respiratory diseases: Secondary | ICD-10-CM | POA: Diagnosis not present

## 2017-02-18 DIAGNOSIS — M4326 Fusion of spine, lumbar region: Secondary | ICD-10-CM | POA: Diagnosis not present

## 2017-02-18 DIAGNOSIS — M549 Dorsalgia, unspecified: Secondary | ICD-10-CM | POA: Diagnosis present

## 2017-02-18 DIAGNOSIS — J45909 Unspecified asthma, uncomplicated: Secondary | ICD-10-CM | POA: Diagnosis not present

## 2017-02-18 DIAGNOSIS — Z881 Allergy status to other antibiotic agents status: Secondary | ICD-10-CM | POA: Diagnosis not present

## 2017-02-18 DIAGNOSIS — Z8249 Family history of ischemic heart disease and other diseases of the circulatory system: Secondary | ICD-10-CM | POA: Diagnosis not present

## 2017-02-18 DIAGNOSIS — Z880 Allergy status to penicillin: Secondary | ICD-10-CM | POA: Diagnosis not present

## 2017-02-18 DIAGNOSIS — M4316 Spondylolisthesis, lumbar region: Secondary | ICD-10-CM | POA: Diagnosis not present

## 2017-02-18 DIAGNOSIS — Z6841 Body Mass Index (BMI) 40.0 and over, adult: Secondary | ICD-10-CM | POA: Diagnosis not present

## 2017-02-18 DIAGNOSIS — S86011A Strain of right Achilles tendon, initial encounter: Secondary | ICD-10-CM | POA: Diagnosis not present

## 2017-02-18 DIAGNOSIS — Z7951 Long term (current) use of inhaled steroids: Secondary | ICD-10-CM | POA: Diagnosis not present

## 2017-02-18 DIAGNOSIS — K219 Gastro-esophageal reflux disease without esophagitis: Secondary | ICD-10-CM | POA: Diagnosis not present

## 2017-02-18 DIAGNOSIS — I10 Essential (primary) hypertension: Secondary | ICD-10-CM | POA: Diagnosis not present

## 2017-02-18 HISTORY — DX: Nausea with vomiting, unspecified: R11.2

## 2017-02-18 HISTORY — DX: Other specified postprocedural states: Z98.890

## 2017-02-18 LAB — BASIC METABOLIC PANEL
Anion gap: 8 (ref 5–15)
BUN: 6 mg/dL (ref 6–20)
CALCIUM: 9 mg/dL (ref 8.9–10.3)
CO2: 25 mmol/L (ref 22–32)
Chloride: 105 mmol/L (ref 101–111)
Creatinine, Ser: 0.61 mg/dL (ref 0.44–1.00)
GFR calc Af Amer: >60 mL/min (ref >60)
GLUCOSE: 108 mg/dL — AB (ref 65–99)
Potassium: 4 mmol/L (ref 3.5–5.1)
Sodium: 138 mmol/L (ref 135–145)

## 2017-02-18 LAB — CBC
HEMATOCRIT: 35.4 % — AB (ref 36.0–46.0)
Hemoglobin: 11.5 g/dL — ABNORMAL LOW (ref 12.0–15.0)
MCH: 29.3 pg (ref 26.0–34.0)
MCHC: 32.5 g/dL (ref 30.0–36.0)
MCV: 90.3 fL (ref 78.0–100.0)
PLATELETS: 276 10*3/uL (ref 150–400)
RBC: 3.92 MIL/uL (ref 3.87–5.11)
RDW: 14.6 % (ref 11.5–15.5)
WBC: 7.9 10*3/uL (ref 4.0–10.5)

## 2017-02-18 LAB — ABO/RH: ABO/RH(D): A POS

## 2017-02-18 LAB — TYPE AND SCREEN
ABO/RH(D): A POS
Antibody Screen: NEGATIVE

## 2017-02-18 NOTE — Pre-Procedure Instructions (Addendum)
Erin Vaughn  02/18/2017       Your procedure is scheduled on 02/20/2017.  Report to [facility] Admitting at 9 A.M.  Call this number if you have problems the morning of surgery:  [phone]   Remember:  Do not eat food or drink liquids after midnight.  Take these medicines the morning of surgery with A SIP OF WATER: bupropion(Wellbutrin), Gabapentin, pantoprazole(Protonix), and Flonase.if needed  Stop herbal medications, NSAIDS (Ibuprofen, voltaren) and phentermine as of today(02/18/17). all vitamins,tumeric,primrose oil   Do not wear jewelry, make-up or nail polish.  Do not wear lotions, powders, or perfumes, or deodorant.  Do not shave 48 hours prior to surgery.    [facility] is not responsible for any belongings or valuables.  Contacts, dentures or bridgework may not be worn into surgery.  Leave your suitcase in the car.  After surgery it may be brought to your room.  For patients admitted to the hospital, discharge time will be determined by your treatment team.  Patients discharged the day of surgery will not be allowed to drive home.   Special instructions:   Special Instructions:  - Preparing for Surgery  Before surgery, you can play an important role.  Because skin is not sterile, your skin needs to be as free of germs as possible.  You can reduce the number of germs on you skin by washing with CHG (chlorahexidine gluconate) soap before surgery.  CHG is an antiseptic cleaner which kills germs and bonds with the skin to continue killing germs even after washing.  Please DO NOT use if you have an allergy to CHG or antibacterial soaps.  If your skin becomes reddened/irritated stop using the CHG and inform your nurse when you arrive at Short Stay.  Do not shave (including legs and underarms) for at least 48 hours prior to the first CHG shower.  You may shave your face.  Please follow these instructions carefully:   1.  Shower with CHG Soap  the night before surgery and the morning of Surgery.  2.  If you choose to wash your hair, wash your hair first as usual with your normal shampoo.  3.  After you shampoo, rinse your hair and body thoroughly to remove the Shampoo.  4.  Use CHG as you would any other liquid soap.  You can apply chg directly  to the skin and wash gently with scrungie or a clean washcloth.  5.  Apply the CHG Soap to your body ONLY FROM THE NECK DOWN.  Do not use on open wounds or open sores.  Avoid contact with your eyes ears, mouth and genitals (private parts).  Wash genitals (private parts)       with your normal soap.  6.  Wash thoroughly, paying special attention to the area where your surgery will be performed.  7.  Thoroughly rinse your body with warm water from the neck down.  8.  DO NOT shower/wash with your normal soap after using and rinsing off the CHG Soap.  9.  Pat yourself dry with a clean towel.            10.  Wear clean pajamas.            11.  Place clean sheets on your bed the night of your first shower and do not sleep with pets.  Day of Surgery  Do not apply any lotions/deodorants the morning of surgery.  Please wear clean clothes  to the hospital/surgery center.  Please read over the fact sheets that you were given.

## 2017-02-20 ENCOUNTER — Inpatient Hospital Stay (HOSPITAL_COMMUNITY): Payer: BLUE CROSS/BLUE SHIELD | Admitting: Certified Registered Nurse Anesthetist

## 2017-02-20 ENCOUNTER — Encounter (HOSPITAL_COMMUNITY): Payer: Self-pay | Admitting: Urology

## 2017-02-20 ENCOUNTER — Inpatient Hospital Stay (HOSPITAL_COMMUNITY)
Admission: RE | Admit: 2017-02-20 | Discharge: 2017-02-21 | DRG: 460 | Disposition: A | Discharge status: Home / Self Care | Payer: BLUE CROSS/BLUE SHIELD | Source: Ambulatory Visit | Attending: Neurological Surgery | Admitting: Neurological Surgery

## 2017-02-20 ENCOUNTER — Encounter (HOSPITAL_COMMUNITY)
Admission: RE | Disposition: A | Discharge status: Home / Self Care | Payer: Self-pay | Source: Ambulatory Visit | Attending: Neurological Surgery

## 2017-02-20 ENCOUNTER — Inpatient Hospital Stay (HOSPITAL_COMMUNITY): Payer: BLUE CROSS/BLUE SHIELD

## 2017-02-20 DIAGNOSIS — Z419 Encounter for procedure for purposes other than remedying health state, unspecified: Secondary | ICD-10-CM

## 2017-02-20 DIAGNOSIS — K219 Gastro-esophageal reflux disease without esophagitis: Secondary | ICD-10-CM | POA: Diagnosis present

## 2017-02-20 DIAGNOSIS — Z8249 Family history of ischemic heart disease and other diseases of the circulatory system: Secondary | ICD-10-CM | POA: Diagnosis not present

## 2017-02-20 DIAGNOSIS — Z91048 Other nonmedicinal substance allergy status: Secondary | ICD-10-CM | POA: Diagnosis not present

## 2017-02-20 DIAGNOSIS — S86011A Strain of right Achilles tendon, initial encounter: Secondary | ICD-10-CM | POA: Diagnosis not present

## 2017-02-20 DIAGNOSIS — M4316 Spondylolisthesis, lumbar region: Secondary | ICD-10-CM | POA: Diagnosis not present

## 2017-02-20 DIAGNOSIS — Z981 Arthrodesis status: Secondary | ICD-10-CM

## 2017-02-20 DIAGNOSIS — Z7951 Long term (current) use of inhaled steroids: Secondary | ICD-10-CM

## 2017-02-20 DIAGNOSIS — F329 Major depressive disorder, single episode, unspecified: Secondary | ICD-10-CM | POA: Diagnosis present

## 2017-02-20 DIAGNOSIS — I10 Essential (primary) hypertension: Secondary | ICD-10-CM | POA: Diagnosis present

## 2017-02-20 DIAGNOSIS — M48061 Spinal stenosis, lumbar region without neurogenic claudication: Secondary | ICD-10-CM | POA: Diagnosis not present

## 2017-02-20 DIAGNOSIS — F17210 Nicotine dependence, cigarettes, uncomplicated: Secondary | ICD-10-CM | POA: Diagnosis present

## 2017-02-20 DIAGNOSIS — Z6841 Body Mass Index (BMI) 40.0 and over, adult: Secondary | ICD-10-CM

## 2017-02-20 DIAGNOSIS — M4326 Fusion of spine, lumbar region: Secondary | ICD-10-CM | POA: Diagnosis not present

## 2017-02-20 DIAGNOSIS — J45909 Unspecified asthma, uncomplicated: Secondary | ICD-10-CM | POA: Diagnosis present

## 2017-02-20 DIAGNOSIS — Z881 Allergy status to other antibiotic agents status: Secondary | ICD-10-CM

## 2017-02-20 DIAGNOSIS — Z825 Family history of asthma and other chronic lower respiratory diseases: Secondary | ICD-10-CM | POA: Diagnosis not present

## 2017-02-20 DIAGNOSIS — Z880 Allergy status to penicillin: Secondary | ICD-10-CM

## 2017-02-20 DIAGNOSIS — M549 Dorsalgia, unspecified: Secondary | ICD-10-CM | POA: Diagnosis present

## 2017-02-20 DIAGNOSIS — F419 Anxiety disorder, unspecified: Secondary | ICD-10-CM | POA: Diagnosis present

## 2017-02-20 LAB — HCG, SERUM, QUALITATIVE: Preg, Serum: POSITIVE — AB

## 2017-02-20 LAB — HCG, QUANTITATIVE, PREGNANCY

## 2017-02-20 SURGERY — POSTERIOR LUMBAR FUSION 1 LEVEL
Anesthesia: General | Site: Back

## 2017-02-20 MED ORDER — CHLORHEXIDINE GLUCONATE CLOTH 2 % EX PADS: 6.0000 | MEDICATED_PAD | Freq: Once | CUTANEOUS | Status: DC

## 2017-02-20 MED ORDER — PROMETHAZINE HCL 25 MG/ML IJ SOLN: 6.2500 mg | INTRAMUSCULAR | Status: DC | PRN

## 2017-02-20 MED ORDER — ROCURONIUM BROMIDE 10 MG/ML (PF) SYRINGE
PREFILLED_SYRINGE | INTRAVENOUS | Status: DC | PRN
  Administered 2017-02-20: 50 mg via INTRAVENOUS
  Administered 2017-02-20: 20 mg via INTRAVENOUS
  Administered 2017-02-20: 10 mg via INTRAVENOUS
  Administered 2017-02-20: 20 mg via INTRAVENOUS

## 2017-02-20 MED ORDER — THROMBIN 20000 UNITS EX SOLR
CUTANEOUS | Status: AC
  Filled 2017-02-20: qty 20000

## 2017-02-20 MED ORDER — THROMBIN 5000 UNITS EX SOLR
CUTANEOUS | Status: AC
  Filled 2017-02-20: qty 5000

## 2017-02-20 MED ORDER — CELECOXIB 200 MG PO CAPS
200.0000 mg | ORAL_CAPSULE | Freq: Two times a day (BID) | ORAL | Status: DC
  Administered 2017-02-20 – 2017-02-21 (×2): 200 mg via ORAL
  Filled 2017-02-20 (×2): qty 1

## 2017-02-20 MED ORDER — SCOPOLAMINE 1 MG/3DAYS TD PT72
1.0000 | MEDICATED_PATCH | Freq: Once | TRANSDERMAL | Status: AC
  Administered 2017-02-20: 1 via TRANSDERMAL

## 2017-02-20 MED ORDER — SODIUM CHLORIDE 0.9% FLUSH: 3.0000 mL | INTRAVENOUS | Status: DC | PRN

## 2017-02-20 MED ORDER — HYDROMORPHONE HCL 1 MG/ML IJ SOLN
INTRAMUSCULAR | Status: AC
  Filled 2017-02-20: qty 1

## 2017-02-20 MED ORDER — HYDROMORPHONE HCL 1 MG/ML IJ SOLN
0.2500 mg | INTRAMUSCULAR | Status: DC | PRN
  Administered 2017-02-20 (×4): 0.5 mg via INTRAVENOUS

## 2017-02-20 MED ORDER — SODIUM CHLORIDE 0.9 % IR SOLN
Status: DC | PRN
  Administered 2017-02-20: 14:00:00

## 2017-02-20 MED ORDER — 0.9 % SODIUM CHLORIDE (POUR BTL) OPTIME
TOPICAL | Status: DC | PRN
  Administered 2017-02-20: 1000 mL

## 2017-02-20 MED ORDER — HYDROCHLOROTHIAZIDE 12.5 MG PO CAPS
12.5000 mg | ORAL_CAPSULE | Freq: Every day | ORAL | Status: DC
  Filled 2017-02-20: qty 1

## 2017-02-20 MED ORDER — IRBESARTAN 300 MG PO TABS
300.0000 mg | ORAL_TABLET | Freq: Every day | ORAL | Status: DC
  Filled 2017-02-20: qty 1

## 2017-02-20 MED ORDER — PROPOFOL 10 MG/ML IV BOLUS
INTRAVENOUS | Status: DC | PRN
  Administered 2017-02-20: 150 mg via INTRAVENOUS

## 2017-02-20 MED ORDER — ONDANSETRON HCL 4 MG/2ML IJ SOLN: 4.0000 mg | Freq: Four times a day (QID) | INTRAMUSCULAR | Status: DC | PRN

## 2017-02-20 MED ORDER — DEXAMETHASONE SODIUM PHOSPHATE 10 MG/ML IJ SOLN
INTRAMUSCULAR | Status: AC
  Filled 2017-02-20: qty 1

## 2017-02-20 MED ORDER — ARTIFICIAL TEARS OPHTHALMIC OINT
TOPICAL_OINTMENT | OPHTHALMIC | Status: AC
  Filled 2017-02-20: qty 3.5

## 2017-02-20 MED ORDER — OXYCODONE HCL 5 MG/5ML PO SOLN: 5.0000 mg | Freq: Once | ORAL | Status: AC | PRN

## 2017-02-20 MED ORDER — SODIUM CHLORIDE 0.9 % IV SOLN
6.0000 mg/kg | INTRAVENOUS | Status: DC
  Administered 2017-02-20: 748 mg via INTRAVENOUS
  Filled 2017-02-20: qty 14.96

## 2017-02-20 MED ORDER — ROCURONIUM BROMIDE 10 MG/ML (PF) SYRINGE
PREFILLED_SYRINGE | INTRAVENOUS | Status: AC
  Filled 2017-02-20: qty 5

## 2017-02-20 MED ORDER — SODIUM CHLORIDE 0.9 % IV SOLN: 250.0000 mL | INTRAVENOUS | Status: DC

## 2017-02-20 MED ORDER — LIDOCAINE 2% (20 MG/ML) 5 ML SYRINGE
INTRAMUSCULAR | Status: AC
  Filled 2017-02-20: qty 5

## 2017-02-20 MED ORDER — VANCOMYCIN HCL 1000 MG IV SOLR
INTRAVENOUS | Status: AC
  Filled 2017-02-20: qty 1000

## 2017-02-20 MED ORDER — FENTANYL CITRATE (PF) 250 MCG/5ML IJ SOLN
INTRAMUSCULAR | Status: DC | PRN
  Administered 2017-02-20 (×3): 50 ug via INTRAVENOUS
  Administered 2017-02-20: 100 ug via INTRAVENOUS
  Administered 2017-02-20 (×2): 25 ug via INTRAVENOUS
  Administered 2017-02-20: 50 ug via INTRAVENOUS
  Administered 2017-02-20: 25 ug via INTRAVENOUS

## 2017-02-20 MED ORDER — LIDOCAINE 2% (20 MG/ML) 5 ML SYRINGE
INTRAMUSCULAR | Status: DC | PRN
  Administered 2017-02-20: 100 mg via INTRAVENOUS

## 2017-02-20 MED ORDER — OXYCODONE HCL 5 MG PO TABS
ORAL_TABLET | ORAL | Status: AC
  Filled 2017-02-20: qty 1

## 2017-02-20 MED ORDER — ACETAMINOPHEN 325 MG PO TABS: 650.0000 mg | ORAL_TABLET | ORAL | Status: DC | PRN

## 2017-02-20 MED ORDER — SCOPOLAMINE 1 MG/3DAYS TD PT72
MEDICATED_PATCH | TRANSDERMAL | Status: AC
  Filled 2017-02-20: qty 1

## 2017-02-20 MED ORDER — OXYCODONE HCL 5 MG PO TABS
5.0000 mg | ORAL_TABLET | Freq: Once | ORAL | Status: AC | PRN
  Administered 2017-02-20: 5 mg via ORAL

## 2017-02-20 MED ORDER — MONTELUKAST SODIUM 10 MG PO TABS
10.0000 mg | ORAL_TABLET | Freq: Every day | ORAL | Status: DC
  Administered 2017-02-20: 10 mg via ORAL
  Filled 2017-02-20: qty 1

## 2017-02-20 MED ORDER — PHENOL 1.4 % MT LIQD: 1.0000 | OROMUCOSAL | Status: DC | PRN

## 2017-02-20 MED ORDER — ACETAMINOPHEN 650 MG RE SUPP
650.0000 mg | RECTAL | Status: DC | PRN
Start: 2017-02-20 — End: 2017-02-21

## 2017-02-20 MED ORDER — ARTIFICIAL TEARS OPHTHALMIC OINT
TOPICAL_OINTMENT | OPHTHALMIC | Status: DC | PRN
  Administered 2017-02-20: 1 via OPHTHALMIC

## 2017-02-20 MED ORDER — GABAPENTIN 600 MG PO TABS
600.0000 mg | ORAL_TABLET | Freq: Two times a day (BID) | ORAL | Status: DC
  Administered 2017-02-20 – 2017-02-21 (×2): 600 mg via ORAL
  Filled 2017-02-20 (×2): qty 1

## 2017-02-20 MED ORDER — GELATIN ABSORBABLE MT POWD
OROMUCOSAL | Status: DC | PRN
  Administered 2017-02-20: 14:00:00 via TOPICAL

## 2017-02-20 MED ORDER — TELMISARTAN-HCTZ 80-12.5 MG PO TABS: 1.0000 | ORAL_TABLET | Freq: Every day | ORAL | Status: DC

## 2017-02-20 MED ORDER — PANTOPRAZOLE SODIUM 40 MG PO TBEC
40.0000 mg | DELAYED_RELEASE_TABLET | Freq: Every day | ORAL | Status: DC
  Administered 2017-02-21: 40 mg via ORAL
  Filled 2017-02-20: qty 1

## 2017-02-20 MED ORDER — MIDAZOLAM HCL 2 MG/2ML IJ SOLN
INTRAMUSCULAR | Status: AC
  Filled 2017-02-20: qty 2

## 2017-02-20 MED ORDER — MIDAZOLAM HCL 5 MG/5ML IJ SOLN
INTRAMUSCULAR | Status: DC | PRN
  Administered 2017-02-20: 2 mg via INTRAVENOUS

## 2017-02-20 MED ORDER — SENNA 8.6 MG PO TABS
1.0000 | ORAL_TABLET | Freq: Two times a day (BID) | ORAL | Status: DC
  Administered 2017-02-20 – 2017-02-21 (×2): 8.6 mg via ORAL
  Filled 2017-02-20 (×2): qty 1

## 2017-02-20 MED ORDER — ONDANSETRON HCL 4 MG/2ML IJ SOLN
INTRAMUSCULAR | Status: DC | PRN
  Administered 2017-02-20: 4 mg via INTRAVENOUS

## 2017-02-20 MED ORDER — TIZANIDINE HCL 4 MG PO TABS
4.0000 mg | ORAL_TABLET | Freq: Three times a day (TID) | ORAL | Status: DC | PRN
  Administered 2017-02-20 – 2017-02-21 (×2): 4 mg via ORAL
  Filled 2017-02-20 (×2): qty 1

## 2017-02-20 MED ORDER — DEXAMETHASONE SODIUM PHOSPHATE 10 MG/ML IJ SOLN
INTRAMUSCULAR | Status: DC | PRN
  Administered 2017-02-20: 10 mg via INTRAVENOUS

## 2017-02-20 MED ORDER — SUGAMMADEX SODIUM 500 MG/5ML IV SOLN
INTRAVENOUS | Status: DC | PRN
  Administered 2017-02-20: 250 mg via INTRAVENOUS

## 2017-02-20 MED ORDER — PROPOFOL 10 MG/ML IV BOLUS
INTRAVENOUS | Status: AC
  Filled 2017-02-20: qty 20

## 2017-02-20 MED ORDER — MEPERIDINE HCL 25 MG/ML IJ SOLN: 6.2500 mg | INTRAMUSCULAR | Status: DC | PRN

## 2017-02-20 MED ORDER — SODIUM CHLORIDE 0.9% FLUSH: 3.0000 mL | Freq: Two times a day (BID) | INTRAVENOUS | Status: DC

## 2017-02-20 MED ORDER — ONDANSETRON HCL 4 MG/2ML IJ SOLN
INTRAMUSCULAR | Status: AC
  Filled 2017-02-20: qty 2

## 2017-02-20 MED ORDER — POTASSIUM CHLORIDE IN NACL 20-0.9 MEQ/L-% IV SOLN: INTRAVENOUS | Status: DC

## 2017-02-20 MED ORDER — MORPHINE SULFATE (PF) 4 MG/ML IV SOLN: 2.0000 mg | INTRAVENOUS | Status: DC | PRN

## 2017-02-20 MED ORDER — BUPROPION HCL ER (XL) 150 MG PO TB24
150.0000 mg | ORAL_TABLET | Freq: Every day | ORAL | Status: DC
  Administered 2017-02-21: 150 mg via ORAL
  Filled 2017-02-20: qty 1

## 2017-02-20 MED ORDER — ONDANSETRON HCL 4 MG PO TABS: 4.0000 mg | ORAL_TABLET | Freq: Four times a day (QID) | ORAL | Status: DC | PRN

## 2017-02-20 MED ORDER — LACTATED RINGERS IV SOLN
INTRAVENOUS | Status: DC
Start: 2017-02-20 — End: 2017-02-20
  Administered 2017-02-20 (×3): via INTRAVENOUS

## 2017-02-20 MED ORDER — MENTHOL 3 MG MT LOZG: 1.0000 | LOZENGE | OROMUCOSAL | Status: DC | PRN

## 2017-02-20 MED ORDER — FENTANYL CITRATE (PF) 250 MCG/5ML IJ SOLN
INTRAMUSCULAR | Status: AC
  Filled 2017-02-20: qty 5

## 2017-02-20 MED ORDER — BUPIVACAINE HCL (PF) 0.25 % IJ SOLN
INTRAMUSCULAR | Status: AC
  Filled 2017-02-20: qty 30

## 2017-02-20 MED ORDER — OXYCODONE HCL 5 MG PO TABS
5.0000 mg | ORAL_TABLET | ORAL | Status: DC | PRN
  Administered 2017-02-20 (×2): 5 mg via ORAL
  Administered 2017-02-21 (×2): 10 mg via ORAL
  Filled 2017-02-20 (×2): qty 2
  Filled 2017-02-20: qty 1

## 2017-02-20 MED ORDER — BUPIVACAINE HCL (PF) 0.25 % IJ SOLN
INTRAMUSCULAR | Status: DC | PRN
  Administered 2017-02-20: 3 mL

## 2017-02-20 SURGICAL SUPPLY — 60 items
ADH SKN CLS APL DERMABOND .7 (GAUZE/BANDAGES/DRESSINGS) ×1
APL SKNCLS STERI-STRIP NONHPOA (GAUZE/BANDAGES/DRESSINGS) ×1
ATEC PORO TI PS 10D 9W 25X8X10 (Bone Implant) ×4 IMPLANT
BAG DECANTER FOR FLEXI CONT (MISCELLANEOUS) ×2 IMPLANT
BASKET BONE COLLECTION (BASKET) ×2 IMPLANT
BENZOIN TINCTURE PRP APPL 2/3 (GAUZE/BANDAGES/DRESSINGS) ×2 IMPLANT
BLADE CLIPPER SURG (BLADE) IMPLANT
BUR MATCHSTICK NEURO 3.0 LAGG (BURR) ×2 IMPLANT
CANISTER SUCT 3000ML PPV (MISCELLANEOUS) ×2 IMPLANT
CARTRIDGE OIL MAESTRO DRILL (MISCELLANEOUS) ×1 IMPLANT
CONT SPEC 4OZ CLIKSEAL STRL BL (MISCELLANEOUS) ×2 IMPLANT
COVER BACK TABLE 60X90IN (DRAPES) ×2 IMPLANT
DERMABOND ADVANCED (GAUZE/BANDAGES/DRESSINGS) ×1
DERMABOND ADVANCED .7 DNX12 (GAUZE/BANDAGES/DRESSINGS) IMPLANT
DIFFUSER DRILL AIR PNEUMATIC (MISCELLANEOUS) ×2 IMPLANT
DRAPE C-ARM 42X72 X-RAY (DRAPES) ×4 IMPLANT
DRAPE C-ARMOR (DRAPES) ×1 IMPLANT
DRAPE LAPAROTOMY 100X72X124 (DRAPES) ×2 IMPLANT
DRAPE POUCH INSTRU U-SHP 10X18 (DRAPES) ×2 IMPLANT
DRAPE SURG 17X23 STRL (DRAPES) ×2 IMPLANT
DRSG OPSITE POSTOP 4X6 (GAUZE/BANDAGES/DRESSINGS) ×1 IMPLANT
DURAPREP 26ML APPLICATOR (WOUND CARE) ×2 IMPLANT
ELECT REM PT RETURN 9FT ADLT (ELECTROSURGICAL) ×2
ELECTRODE REM PT RTRN 9FT ADLT (ELECTROSURGICAL) ×1 IMPLANT
EVACUATOR 1/8 PVC DRAIN (DRAIN) ×1 IMPLANT
GAUZE SPONGE 4X4 16PLY XRAY LF (GAUZE/BANDAGES/DRESSINGS) IMPLANT
GLOVE BIO SURGEON STRL SZ7 (GLOVE) IMPLANT
GLOVE BIO SURGEON STRL SZ8 (GLOVE) ×4 IMPLANT
GLOVE BIOGEL PI IND STRL 7.0 (GLOVE) IMPLANT
GLOVE BIOGEL PI INDICATOR 7.0 (GLOVE)
GOWN STRL REUS W/ TWL LRG LVL3 (GOWN DISPOSABLE) IMPLANT
GOWN STRL REUS W/ TWL XL LVL3 (GOWN DISPOSABLE) ×2 IMPLANT
GOWN STRL REUS W/TWL 2XL LVL3 (GOWN DISPOSABLE) IMPLANT
GOWN STRL REUS W/TWL LRG LVL3 (GOWN DISPOSABLE)
GOWN STRL REUS W/TWL XL LVL3 (GOWN DISPOSABLE) ×4
HEMOSTAT POWDER KIT SURGIFOAM (HEMOSTASIS) ×1 IMPLANT
KIT BASIN OR (CUSTOM PROCEDURE TRAY) ×2 IMPLANT
KIT ROOM TURNOVER OR (KITS) ×2 IMPLANT
NDL HYPO 25X1 1.5 SAFETY (NEEDLE) ×1 IMPLANT
NEEDLE HYPO 25X1 1.5 SAFETY (NEEDLE) ×2 IMPLANT
NS IRRIG 1000ML POUR BTL (IV SOLUTION) ×2 IMPLANT
OIL CARTRIDGE MAESTRO DRILL (MISCELLANEOUS) ×2
PACK LAMINECTOMY NEURO (CUSTOM PROCEDURE TRAY) ×2 IMPLANT
PAD ARMBOARD 7.5X6 YLW CONV (MISCELLANEOUS) ×6 IMPLANT
PUTTY KINEX BIOACTIVE 5CC (Bone Implant) ×1 IMPLANT
ROD PC 5.5X35 TI ARSENAL (Rod) ×2 IMPLANT
SCREW CORT CBX 5.5X40 (Screw) ×4 IMPLANT
SCREW SET SPINAL ARSENAL 47127 (Screw) ×4 IMPLANT
SPACER PORUS ATEC 10D9W25X8X10 (Bone Implant) IMPLANT
SPONGE LAP 4X18 X RAY DECT (DISPOSABLE) IMPLANT
SPONGE SURGIFOAM ABS GEL 100 (HEMOSTASIS) ×2 IMPLANT
STRIP CLOSURE SKIN 1/2X4 (GAUZE/BANDAGES/DRESSINGS) ×3 IMPLANT
SUT VIC AB 0 CT1 18XCR BRD8 (SUTURE) ×1 IMPLANT
SUT VIC AB 0 CT1 8-18 (SUTURE) ×2
SUT VIC AB 2-0 CP2 18 (SUTURE) ×2 IMPLANT
SUT VIC AB 3-0 SH 8-18 (SUTURE) ×4 IMPLANT
TOWEL GREEN STERILE (TOWEL DISPOSABLE) ×2 IMPLANT
TOWEL GREEN STERILE FF (TOWEL DISPOSABLE) ×2 IMPLANT
TRAY FOLEY W/METER SILVER 16FR (SET/KITS/TRAYS/PACK) ×2 IMPLANT
WATER STERILE IRR 1000ML POUR (IV SOLUTION) ×2 IMPLANT

## 2017-02-20 NOTE — Transfer of Care (Signed)
Immediate Anesthesia Transfer of Care Note  Patient: Erin Vaughn  Procedure(s) Performed: Lumbar four-five Posterior lumbar interbody fusion (N/A Back)  Patient Location: PACU  Anesthesia Type:General  Level of Consciousness: awake, alert  and oriented  Airway & Oxygen Therapy: Patient Spontanous Breathing and Patient connected to nasal cannula oxygen  Post-op Assessment: Report given to RN and Post -op Vital signs reviewed and stable  Post vital signs: Reviewed and stable  Last Vitals:  Vitals:   02/20/17 0849  BP: (!) 146/71  Pulse: 83  Resp: 18  Temp: 36.8 C  SpO2: 100%    Last Pain:  Vitals:   02/20/17 0904  TempSrc:   PainSc: 4          Complications: No apparent anesthesia complications

## 2017-02-20 NOTE — Anesthesia Preprocedure Evaluation (Signed)
Anesthesia Evaluation  Patient identified by MRN, date of birth, ID band Patient awake    Reviewed: Allergy & Precautions, H&P , NPO status , Patient's Chart, lab work & pertinent test results  History of Anesthesia Complications (+) PONV and history of anesthetic complications  Airway Mallampati: II  TM Distance: >3 FB Neck ROM: Full    Dental no notable dental hx.    Pulmonary neg pulmonary ROS, asthma , Current Smoker, former smoker,    Pulmonary exam normal breath sounds clear to auscultation       Cardiovascular Exercise Tolerance: Good hypertension, Pt. on medications negative cardio ROS Normal cardiovascular exam Rhythm:Regular Rate:Normal     Neuro/Psych  Headaches, PSYCHIATRIC DISORDERS Anxiety Depression  Neuromuscular disease negative neurological ROS  negative psych ROS   GI/Hepatic negative GI ROS, Neg liver ROS, GERD  Medicated,  Endo/Other  Morbid obesity  Renal/GU negative Renal ROS  negative genitourinary   Musculoskeletal negative musculoskeletal ROS (+)   Abdominal (+) + obese,   Peds negative pediatric ROS (+)  Hematology negative hematology ROS (+)   Anesthesia Other Findings   Reproductive/Obstetrics negative OB ROS                             Anesthesia Physical  Anesthesia Plan  ASA: III  Anesthesia Plan: General   Post-op Pain Management:    Induction: Intravenous  PONV Risk Score and Plan: 3 and Ondansetron, Dexamethasone and Midazolam  Airway Management Planned: Oral ETT  Additional Equipment:   Intra-op Plan:   Post-operative Plan: Extubation in OR  Informed Consent: I have reviewed the patients History and Physical, chart, labs and discussed the procedure including the risks, benefits and alternatives for the proposed anesthesia with the patient or authorized representative who has indicated his/her understanding and acceptance.   Dental  advisory given  Plan Discussed with: CRNA  Anesthesia Plan Comments:         Anesthesia Quick Evaluation

## 2017-02-20 NOTE — Anesthesia Postprocedure Evaluation (Signed)
Anesthesia Post Note  Patient: Erin Vaughn  Procedure(s) Performed: Lumbar four-five Posterior lumbar interbody fusion (N/A Back)     Anesthesia Type: General    Last Vitals:  Vitals:   02/20/17 1701 02/20/17 1733  BP: 115/80 104/70  Pulse: 83 82  Resp: 15 18  Temp: (!) 36.3 C 36.7 C  SpO2: 100% 95%    Last Pain:  Vitals:   02/20/17 1701  TempSrc:   PainSc: Anita

## 2017-02-20 NOTE — Op Note (Signed)
02/20/2017  3:47 PM  PATIENT:  Erin Vaughn  46 y.o. female  PRE-OPERATIVE DIAGNOSIS:  Spondylolisthesis with stenosis L4-5 with back and leg pain  POST-OPERATIVE DIAGNOSIS:  same  PROCEDURE:   1. Decompressive lumbar laminectomy L4-5 requiring more work than would be required for a simple exposure of the disk for PLIF in order to adequately decompress the neural elements and address the spinal stenosis 2. Posterior lumbar interbody fusion L4-5 using porous titanium interbody cages packed with morcellized allograft and autograft 3. Posterior fixation L4-5 using Alphatec cortical pedicle screws.  4. Intertransverse arthrodesis L4-5 using morcellized autograft and allograft.  SURGEON:  Sherley Bounds, MD  ASSISTANTSShelba Flake FNP  ANESTHESIA:  General  EBL: 150 ml  Total I/O In: 1000 [I.V.:1000] Out: 250 [Urine:100; Blood:150]  BLOOD ADMINISTERED:none  DRAINS: none   INDICATION FOR PROCEDURE: This patient presented with back and leg pain. Imaging revealed spondylolisthesis L4-5 with spinal stenosis. The patient tried a reasonable attempt at conservative medical measures without relief. I recommended decompression and instrumented fusion to address the stenosis as well as the segmental  instability.  Patient understood the risks, benefits, and alternatives and potential outcomes and wished to proceed.  PROCEDURE DETAILS:  The patient was brought to the operating room. After induction of generalized endotracheal anesthesia the patient was rolled into the prone position on chest rolls and all pressure points were padded. The patient's lumbar region was cleaned and then prepped with DuraPrep and draped in the usual sterile fashion. Anesthesia was injected and then a dorsal midline incision was made and carried down to the lumbosacral fascia. The fascia was opened and the paraspinous musculature was taken down in a subperiosteal fashion to expose L4-L5. A self-retaining retractor was  placed. Intraoperative fluoroscopy confirmed my level, and I started with placement of the L4 cortical pedicle screws. The pedicle screw entry zones were identified utilizing surface landmarks and  AP and lateral fluoroscopy. I scored the cortex with the high-speed drill and then used the hand drill to drill an upward and outward direction into the pedicle. I then tapped line to line. I then placed a 5.5 x 40 mm cortical pedicle screw into the pedicles of L4 bilaterally. I then turned my attention to the decompression and complete lumbar laminectomies, hemi- facetectomies, and foraminotomies were performed at L4-5. The patient had significant spinal stenosis and this required more work than would be required for a simple exposure of the disc for posterior lumbar interbody fusion which would only require a limited laminotomy. Much more generous decompression and generous foraminotomy was undertaken in order to adequately decompress the neural elements and address the patient's leg pain. The yellow ligament was removed to expose the underlying dura and nerve roots, and generous foraminotomies were performed to adequately decompress the neural elements. Both the exiting and traversing nerve roots were decompressed on both sides until a coronary dilator passed easily along the nerve roots. Once the decompression was complete, I turned my attention to the posterior lower lumbar interbody fusion. The epidural venous vasculature was coagulated and cut sharply. Disc space was incised and the initial discectomy was performed with pituitary rongeurs. The disc space was distracted with sequential distractors to a height of 10 mm. We then used a series of scrapers and shavers to prepare the endplates for fusion. The midline was prepared with Epstein curettes. Once the complete discectomy was finished, we packed an appropriate sized interbody cage with local autograft and morcellized allograft, gently retracted the nerve root,  and tapped the cage into position at L4-5.  The midline between the cages was packed with morselized autograft and allograft. We then turned our attention to the placement of the lower pedicle screws. The pedicle screw entry zones were identified utilizing surface landmarks and fluoroscopy. I drilled into each pedicle utilizing the hand drill, and tapped each pedicle with the appropriate tap. We palpated with a ball probe to assure no break in the cortex. We then placed 5.5 x 40 mm cortical pedicle screws into the pedicles bilaterally at L5. We then decorticated the transverse processes and laid a mixture of morcellized autograft and allograft out over these to perform intertransverse arthrodesis at L4-5. We then placed lordotic rods into the multiaxial screw heads of the pedicle screws and locked these in position with the locking caps and anti-torque device. We then checked our construct with AP and lateral fluoroscopy. Irrigated with copious amounts of bacitracin-containing saline solution. Inspected the nerve roots once again to assure adequate decompression, lined to the dura with Gelfoam, placed powdered vancomycin into the wound, and closed the muscle and the fascia with 0 Vicryl. Closed the subcutaneous tissues with 2-0 Vicryl and subcuticular tissues with 3-0 Vicryl. The skin was closed with benzoin and Steri-Strips. Dressing was then applied, the patient was awakened from general anesthesia and transported to the recovery room in stable condition. At the end of the procedure all sponge, needle and instrument counts were correct.   PLAN OF CARE: admit to inpatient  PATIENT DISPOSITION:  PACU - hemodynamically stable.   Delay start of Pharmacological VTE agent (>24hrs) due to surgical blood loss or risk of bleeding:  yes

## 2017-02-20 NOTE — Progress Notes (Signed)
Patient states she drank 8oz of coffee with creamer this AM. Started at 06:45 and finished cup by 07:30. Dr. Sabra Heck notified.

## 2017-02-20 NOTE — Anesthesia Procedure Notes (Signed)
Procedure Name: Intubation Date/Time: 02/20/2017 1:33 PM Performed by: Merrilyn Puma B Pre-anesthesia Checklist: Patient identified, Emergency Drugs available, Suction available, Patient being monitored and Timeout performed Patient Re-evaluated:Patient Re-evaluated prior to induction Oxygen Delivery Method: Circle system utilized Preoxygenation: Pre-oxygenation with 100% oxygen Induction Type: IV induction Ventilation: Mask ventilation without difficulty and Oral airway inserted - appropriate to patient size Laryngoscope Size: Mac and 3 Grade View: Grade III Tube type: Oral Tube size: 7.5 mm Number of attempts: 1 Airway Equipment and Method: Stylet Placement Confirmation: ETT inserted through vocal cords under direct vision,  positive ETCO2,  CO2 detector and breath sounds checked- equal and bilateral Secured at: 22 cm Tube secured with: Tape Dental Injury: Teeth and Oropharynx as per pre-operative assessment

## 2017-02-20 NOTE — H&P (Signed)
Subjective: Patient is a 46 y.o. female admitted for PLIF. Onset of symptoms was several months ago, gradually worsening since that time.  The pain is rated severe, and is located at the across the lower back and radiates to BLE. The pain is described as aching and occurs all day. The symptoms have been progressive. Symptoms are exacerbated by exercise. MRI or CT showed spondylolisthesis L4-5 with stenosis   Past Medical History:  Diagnosis Date  . Achilles tendon rupture    2014  . Anxiety   . Asthma    adult onset no problems  . Complication of anesthesia 2001   woke up during gallbladder surgery  . Depression   . GERD (gastroesophageal reflux disease)   . History of MRSA infection 2010   right side Abdomen  . Hypertension   . Migraine    hx  . PONV (postoperative nausea and vomiting)    prior surger 14 none during gallbladder 2000    Past Surgical History:  Procedure Laterality Date  . ACHILLES TENDON SURGERY Right 05/10/2013   Procedure: RIGHT ACHILLES TENDON REPAIR;  Surgeon: Johnn Hai, MD;  Location: [facility];  Service: Orthopedics;  Laterality: Right;  achilles tendon  . CHOLECYSTECTOMY  2001    Prior to Admission medications   Medication Sig Start Date End Date Taking? Authorizing Provider  buPROPion (WELLBUTRIN XL) 150 MG 24 hr tablet Take 150 mg by mouth daily.   Yes [provider]  Cholecalciferol (VITAMIN D3) 5000 units CAPS Take 5,000 Units by mouth daily.    Yes [provider]  Cyanocobalamin (B-12) 2500 MCG TABS Take 5,000 mcg by mouth daily.    Yes [provider]  diclofenac (VOLTAREN) 75 MG EC tablet Take 75 mg by mouth 2 (two) times daily as needed for pain. 11/25/16  Yes [provider]  Evening Primrose Oil 1000 MG CAPS Take 1,000 mg by mouth at bedtime.   Yes [provider]  fluticasone (FLONASE) 50 MCG/ACT nasal spray Place 1 spray into both nostrils daily as needed for allergies. 11/16/16  Yes [provider]  gabapentin (NEURONTIN) 600 MG tablet Take 600 mg by mouth 2 (two) times daily.  11/25/16  Yes [provider]  montelukast (SINGULAIR) 10 MG tablet Take 10 mg by mouth at bedtime.   Yes [provider]  mupirocin ointment (BACTROBAN) 2 % Place 1 application into the nose 2 (two) times daily. Started 02/15/17 for 5 days twice daily   Yes [provider]  pantoprazole (PROTONIX) 40 MG tablet Take 40 mg by mouth daily before breakfast.  09/02/16  Yes [provider]  telmisartan-hydrochlorothiazide (MICARDIS HCT) 80-12.5 MG tablet Take 1 tablet by mouth at bedtime.  09/02/16  Yes [provider]  tiZANidine (ZANAFLEX) 4 MG tablet Take 4 mg by mouth every 8 (eight) hours as needed for muscle spasms. 11/25/16  Yes [provider]  Turmeric Curcumin 500 MG CAPS Take 500 mg by mouth 2 (two) times daily.   Yes [provider]  phentermine 37.5 MG capsule Take 37.5 mg by mouth every morning.    [provider]   Allergies  Allergen Reactions  . Penicillins Anaphylaxis and Other (See Comments)    Has patient had a PCN reaction causing immediate rash, facial/tongue/throat swelling, SOB or lightheadedness with hypotension: Yes Has patient had a PCN reaction causing severe rash involving mucus membranes or skin necrosis: No Has patient had a PCN reaction that required hospitalization: Yes Has patient  had a PCN reaction occurring within the last 10 years: No If all of the above answers are "NO", then may proceed with Cephalosporin use.   Marland Kitchen Zithromax [Azithromycin] Anaphylaxis, Swelling and Other (See Comments)    Swelling, mouth and throat  . Vancomycin Other (See Comments)    Burning and itching.  ? RED MAN SYNDROME ? "Makes body red"  . Adhesive [Tape] Rash    Use paper tape    Social History  Substance Use Topics  . Smoking status: Current Every Day Smoker    Packs/day: 0.50    Years: 15.00    Types: Cigarettes  .  Smokeless tobacco: Never Used     Comment: PCP is working with patient  . Alcohol use No    Family History  Problem Relation Age of Onset  . Cancer Mother   . COPD Other   . Hypertension Other   . Asthma Other      Review of Systems  Positive ROS: neg  All other systems have been reviewed and were otherwise negative with the exception of those mentioned in the HPI and as above.  Objective: Vital signs in last 24 hours: Temp:  [98.3 F (36.8 C)] 98.3 F (36.8 C) (10/04 0849) Pulse Rate:  [83] 83 (10/04 0849) Resp:  [18] 18 (10/04 0849) BP: (146)/(71) 146/71 (10/04 0849) SpO2:  [100 %] 100 % (10/04 0849) Weight:  [124.7 kg (275 lb)] 124.7 kg (275 lb) (10/04 0849)  General Appearance: Alert, cooperative, no distress, appears stated age Head: Normocephalic, without obvious abnormality, atraumatic Eyes: PERRL, conjunctiva/corneas clear, EOM's intact    Neck: Supple, symmetrical, trachea midline Back: Symmetric, no curvature, ROM normal, no CVA tenderness Lungs:  respirations unlabored Heart: Regular rate and rhythm Abdomen: Soft, non-tender Extremities: Extremities normal, atraumatic, no cyanosis or edema Pulses: 2+ and symmetric all extremities Skin: Skin color, texture, turgor normal, no rashes or lesions  NEUROLOGIC:   Mental status: Alert and oriented x4,  no aphasia, good attention span, fund of knowledge, and memory Motor Exam - grossly normal Sensory Exam - grossly normal Reflexes: absent Coordination - grossly normal Gait - not tested Balance - grossly normal Cranial Nerves: I: smell Not tested  II: visual acuity  OS: nl    OD: nl  II: visual fields Full to confrontation  II: pupils Equal, round, reactive to light  III,VII: ptosis None  III,IV,VI: extraocular muscles  Full ROM  V: mastication Normal  V: facial light touch sensation  Normal  V,VII: corneal reflex  Present  VII: facial muscle function - upper  Normal  VII: facial muscle function - lower  Normal  VIII: hearing Not tested  IX: soft palate elevation  Normal  IX,X: gag reflex Present  XI: trapezius strength  5/5  XI: sternocleidomastoid strength 5/5  XI: neck flexion strength  5/5  XII: tongue strength  Normal    Data Review Lab Results  Component Value Date   WBC 7.9 02/18/2017   HGB 11.5 (L) 02/18/2017   HCT 35.4 (L) 02/18/2017   MCV 90.3 02/18/2017   PLT 276 02/18/2017   Lab Results  Component Value Date   NA 138 02/18/2017   K 4.0 02/18/2017   CL 105 02/18/2017   CO2 25 02/18/2017   BUN 6 02/18/2017   CREATININE 0.61 02/18/2017   GLUCOSE 108 (H) 02/18/2017   No results found for: INR, PROTIME  Assessment/Plan: Patient admitted for PLIF L4-5. Patient has failed a reasonable attempt at conservative therapy.  I explained the condition and procedure to the patient and answered any questions.  Patient wishes to proceed with procedure as planned. Understands risks/ benefits and typical outcomes of procedure.   JONES,DAVID S 02/20/2017 11:28 AM

## 2017-02-20 NOTE — Progress Notes (Signed)
Orthopedic Tech Progress Note Patient Details:  Erin Vaughn Nov 07, 1970 [id number] Patient was a pre-fit. Patient ID: [name], female   DOB: 10/26/1970, 46 y.o.   MRN: [id number]   Braulio Bosch 02/20/2017, 6:00 PM

## 2017-02-21 MED ORDER — METHOCARBAMOL 750 MG PO TABS: 750.0000 mg | ORAL_TABLET | Freq: Four times a day (QID) | ORAL | 0 refills | Status: DC

## 2017-02-21 MED ORDER — GABAPENTIN 600 MG PO TABS: 600.0000 mg | ORAL_TABLET | Freq: Two times a day (BID) | ORAL | 0 refills | Status: DC

## 2017-02-21 MED ORDER — HYDROCODONE-ACETAMINOPHEN 5-325 MG PO TABS: 1.0000 | ORAL_TABLET | ORAL | 0 refills | Status: AC | PRN

## 2017-02-21 NOTE — Progress Notes (Signed)
Patient is discharged from room 3C04 at this time. Alert and in stable condition. IV site d/c'd and instructions read to patient and spouse with understanding verbalized. Left unit via wheelchair with all belongings at side. 

## 2017-02-21 NOTE — Discharge Summary (Signed)
Physician Discharge Summary  Patient ID: Erin Vaughn MRN: [id number] DOB/AGE: 1970-06-10 46 y.o.  Admit date: 02/20/2017 Discharge date: 02/21/2017  Admission Diagnoses: Spondylolisthesis with stenosis L4-5 with back and leg pain    Discharge Diagnoses: same as admitting   Discharged Condition: good  Hospital Course: The patient was admitted on 02/20/2017 and taken to the operating room where the patient underwent Posterior lumbar interbody fusion L4-5. The patient tolerated the procedure well and was taken to the recovery room and then to the floor in stable condition. The hospital course was routine. There were no complications. The wound remained clean dry and intact. Pt had appropriate back soreness. No complaints of new pain or new N/T/W. The patient remained afebrile with stable vital signs, and tolerated a regular diet. The patient continued to increase activities, and pain was well controlled with oral pain medications.   Consults: None  Significant Diagnostic Studies:  Results for orders placed or performed during the hospital encounter of 02/20/17  hCG, serum, qualitative  Result Value Ref Range   Preg, Serum WEAK POSITIVE (A) NEGATIVE  hCG, quantitative, pregnancy  Result Value Ref Range   hCG, Beta Chain, Quant, S <1 <5 mIU/mL    Dg Lumbar Spine 2-3 Views  Result Date: 02/20/2017 CLINICAL DATA:  46 y/o  female undergoing lumbar surgery. EXAM: LUMBAR SPINE - 2-3 VIEW; DG C-ARM 61-120 MIN COMPARISON:  [facility] Lumbar MRI 09/16/2016. Lumbar radiographs 12/04/2009. FLUOROSCOPY TIME:  1 minutes 16 seconds FINDINGS: Normal lumbar segmentation demonstrated on the 2011 radiographs. Two intraoperative fluoroscopic spot views of the lower lumbar spine in the AP and lateral projection. Sequelae of L4-L5 posterior and interbody fusion hardware placement depicted along with some posterior decompression. IMPRESSION: L4-L5 posterior and interbody fusion Electronically Signed    By: Odessa Fleming M.D.   On: 02/20/2017 15:57   Dg C-arm 61-120 Min  Result Date: 02/20/2017 CLINICAL DATA:  46 y/o  female undergoing lumbar surgery. EXAM: LUMBAR SPINE - 2-3 VIEW; DG C-ARM 61-120 MIN COMPARISON:  [facility] Lumbar MRI 09/16/2016. Lumbar radiographs 12/04/2009. FLUOROSCOPY TIME:  1 minutes 16 seconds FINDINGS: Normal lumbar segmentation demonstrated on the 2011 radiographs. Two intraoperative fluoroscopic spot views of the lower lumbar spine in the AP and lateral projection. Sequelae of L4-L5 posterior and interbody fusion hardware placement depicted along with some posterior decompression. IMPRESSION: L4-L5 posterior and interbody fusion Electronically Signed   By: Odessa Fleming M.D.   On: 02/20/2017 15:57    Antibiotics:  Anti-infectives    Start     Dose/Rate Route Frequency Ordered Stop   02/20/17 1414  bacitracin 50,000 Units in sodium chloride irrigation 0.9 % 500 mL irrigation  Status:  Discontinued       As needed 02/20/17 1414 02/20/17 1611   02/20/17 1345  DAPTOmycin (CUBICIN) 748 mg in sodium chloride 0.9 % IVPB  Status:  Discontinued     6 mg/kg  124.7 kg 229.9 mL/hr over 30 Minutes Intravenous Every 24 hours 02/20/17 1348 02/20/17 1722      Discharge Exam: Blood pressure (!) 97/58, pulse 76, temperature 97.7 F (36.5 C), temperature source Oral, resp. rate 18, height 5\' 4"  (1.626 m), weight 124.7 kg (275 lb), SpO2 99 %. Neurologic: Grossly normal Ambulating and voiding well  Discharge Medications:   Allergies as of 02/21/2017      Reactions   Penicillins Anaphylaxis, Other (See Comments)   Has patient had a PCN reaction causing immediate rash, facial/tongue/throat swelling, SOB or lightheadedness  with hypotension: Yes Has patient had a PCN reaction causing severe rash involving mucus membranes or skin necrosis: No Has patient had a PCN reaction that required hospitalization: Yes Has patient had a PCN reaction occurring within the last 10 years: No If all  of the above answers are "NO", then may proceed with Cephalosporin use.   Zithromax [azithromycin] Anaphylaxis, Swelling, Other (See Comments)   Swelling, mouth and throat   Vancomycin Other (See Comments)   Burning and itching.  ? RED MAN SYNDROME ? "Makes body red"   Adhesive [tape] Rash   Use paper tape      Medication List    TAKE these medications   B-12 2500 MCG Tabs Take 5,000 mcg by mouth daily.   buPROPion 150 MG 24 hr tablet Commonly known as:  WELLBUTRIN XL Take 150 mg by mouth daily.   diclofenac 75 MG EC tablet Commonly known as:  VOLTAREN Take 75 mg by mouth 2 (two) times daily as needed for pain.   Evening Primrose Oil 1000 MG Caps Take 1,000 mg by mouth at bedtime.   fluticasone 50 MCG/ACT nasal spray Commonly known as:  FLONASE Place 1 spray into both nostrils daily as needed for allergies.   gabapentin 600 MG tablet Commonly known as:  NEURONTIN Take 600 mg by mouth 2 (two) times daily. What changed:  Another medication with the same name was added. Make sure you understand how and when to take each.   gabapentin 600 MG tablet Commonly known as:  NEURONTIN Take 1 tablet (600 mg total) by mouth 2 (two) times daily. What changed:  You were already taking a medication with the same name, and this prescription was added. Make sure you understand how and when to take each.   HYDROcodone-acetaminophen 5-325 MG tablet Commonly known as:  NORCO/VICODIN Take 1 tablet by mouth every 4 (four) hours as needed for moderate pain.   methocarbamol 750 MG tablet Commonly known as:  ROBAXIN-750 Take 1 tablet (750 mg total) by mouth 4 (four) times daily.   montelukast 10 MG tablet Commonly known as:  SINGULAIR Take 10 mg by mouth at bedtime.   mupirocin ointment 2 % Commonly known as:  BACTROBAN Place 1 application into the nose 2 (two) times daily. Started 02/15/17 for 5 days twice daily   pantoprazole 40 MG tablet Commonly known as:  PROTONIX Take 40 mg by  mouth daily before breakfast.   phentermine 37.5 MG capsule Take 37.5 mg by mouth every morning.   telmisartan-hydrochlorothiazide 80-12.5 MG tablet Commonly known as:  MICARDIS HCT Take 1 tablet by mouth at bedtime.   tiZANidine 4 MG tablet Commonly known as:  ZANAFLEX Take 4 mg by mouth every 8 (eight) hours as needed for muscle spasms.   Turmeric Curcumin 500 MG Caps Take 500 mg by mouth 2 (two) times daily.   Vitamin D3 5000 units Caps Take 5,000 Units by mouth daily.            Durable Medical Equipment        Start     Ordered   02/20/17 1756  DME Walker rolling  Once    Question:  Patient needs a walker to treat with the following condition  Answer:  S/P lumbar fusion   02/20/17 1755   02/20/17 1756  DME 3 n 1  Once     02/20/17 1755      Disposition: home   Final Dx: same as admitting  Discharge Instructions  Remove dressing in 72 hours    Complete by:  As directed    Call MD for:  difficulty breathing, headache or visual disturbances    Complete by:  As directed    Call MD for:  extreme fatigue    Complete by:  As directed    Call MD for:  hives    Complete by:  As directed    Call MD for:  persistant dizziness or light-headedness    Complete by:  As directed    Call MD for:  persistant nausea and vomiting    Complete by:  As directed    Call MD for:  redness, tenderness, or signs of infection (pain, swelling, redness, odor or green/yellow discharge around incision site)    Complete by:  As directed    Call MD for:  severe uncontrolled pain    Complete by:  As directed    Call MD for:  temperature >100.4    Complete by:  As directed    Diet - low sodium heart healthy    Complete by:  As directed    Driving Restrictions    Complete by:  As directed    No driving 2 weeks   Increase activity slowly    Complete by:  As directed    Lifting restrictions    Complete by:  As directed    Nothing more than 8 lbs         Signed: Tiana Loft Geryl Dohn 02/21/2017, 9:50 AM

## 2017-02-21 NOTE — Evaluation (Signed)
Occupational Therapy Evaluation and Discharge Patient Details Name: Erin Vaughn MRN: [id number] DOB: April 28, 1971 Today's Date: 02/21/2017    History of Present Illness Pt is a 46 y/o female who presents s/p L4-L5 PLIF on 02/20/17. PMH significant for achilles tendon rupture s/p repair in 2014, HTN, asthma.    Clinical Impression   This 46 yo female admitted and underwent above presents to acute OT with all education completed, we will D/C from acute OT,    Follow Up Recommendations  No OT follow up    Equipment Recommendations  3 in 1 bedside commode       Precautions / Restrictions Precautions Precautions: Fall;Back Precaution Booklet Issued: Yes (comment) Precaution Comments: Reviewed handout. Pt was cued for precautions during functional mobility.  Required Braces or Orthoses: Spinal Brace Spinal Brace: Lumbar corset;Applied in sitting position Restrictions Weight Bearing Restrictions: No      Mobility Bed Mobility       General bed mobility comments: Pt up in recliner upon my arrival  Transfers Overall transfer level: Needs assistance Equipment used: None Transfers: Sit to/from Stand Sit to Stand: Supervision         General transfer comment: increased time    Balance Overall balance assessment: Needs assistance Sitting-balance support: Feet supported;No upper extremity supported Sitting balance-Leahy Scale: Fair     Standing balance support: No upper extremity supported;During functional activity Standing balance-Leahy Scale: Fair                             ADL either performed or assessed with clinical judgement   ADL                                         General ADL Comments: Educated pt on not bending over the sink when cleaning her dentures, using wet wipes and toilting tongs for peri care, sequence of dressing for most efficiency, side stepping into tub     Vision Patient Visual Report: No change from  baseline              Pertinent Vitals/Pain Pain Assessment: 0-10 Pain Score: 3  Faces Pain Scale: Hurts even more Pain Location: Incision site Pain Descriptors / Indicators: Pressure Pain Intervention(s): Limited activity within patient's tolerance;Monitored during session;Repositioned     Hand Dominance Right   Extremity/Trunk Assessment Upper Extremity Assessment Upper Extremity Assessment: Overall WFL for tasks assessed     Communication Communication Communication: No difficulties   Cognition Arousal/Alertness: Awake/alert Behavior During Therapy: WFL for tasks assessed/performed Overall Cognitive Status: Within Functional Limits for tasks assessed                                                Home Living Family/patient expects to be discharged to:: Private residence Living Arrangements: Spouse/significant other Available Help at Discharge: Family;Available 24 hours/day (Until monday) Type of Home: Mobile home Home Access: Stairs to enter [employer] of Steps: 4 Entrance Stairs-Rails: Right;Left Home Layout: One level     Bathroom Shower/Tub: Tub/shower unit (garden tub combo)   Bathroom Toilet: Standard     Home Equipment: None          Prior Functioning/Environment Level of Independence: Independent  OT Problem List: Decreased range of motion;Obesity;Pain         OT Goals(Current goals can be found in the care plan section) Acute Rehab OT Goals Patient Stated Goal: home today  OT Frequency:                AM-PAC PT "6 Clicks" Daily Activity     Outcome Measure Help from another person eating meals?: None Help from another person taking care of personal grooming?: None Help from another person toileting, which includes using toliet, bedpan, or urinal?: None Help from another person bathing (including washing, rinsing, drying)?: None Help from another person to put on and taking off  regular upper body clothing?: None Help from another person to put on and taking off regular lower body clothing?: None 6 Click Score: 24   End of Session Equipment Utilized During Treatment: Back brace  Activity Tolerance: Patient tolerated treatment well Patient left: in chair;with call bell/phone within reach;with family/visitor present  OT Visit Diagnosis: Unsteadiness on feet (R26.81);Pain Pain - part of body:  (back)                Time: 2010-0712 OT Time Calculation (min): 24 min Charges:  OT General Charges $OT Visit: 1 Visit OT Evaluation $OT Eval Moderate Complexity: 1 Mod OT Treatments $Self Care/Home Management : 8-22 mins [place], OTR/L [phone] 02/21/2017

## 2017-02-21 NOTE — Evaluation (Signed)
Physical Therapy Evaluation Patient Details Name: Erin Vaughn MRN: [id number] DOB: 22-May-1970 Today's Date: 02/21/2017   History of Present Illness  Pt is a 46 y/o female who presents s/p L4-L5 PLIF on 02/20/17. PMH significant for achilles tendon rupture s/p repair in 2014, HTN, asthma.   Clinical Impression  Pt admitted with above diagnosis. Pt currently with functional limitations due to the deficits listed below (see PT Problem List). At the time of PT eval pt was able to perform transfers with up to mod assist and ambulation with gross supervision for safety. Pain is the main limiting factor for transfers at this time. Pt was educated on brace application/wearing schedule, stair training, car transfer, precautions, and walking program/activity progression. Pt will benefit from skilled PT to increase their independence and safety with mobility to allow discharge to the venue listed below.       Follow Up Recommendations No PT follow up;Supervision for mobility/OOB    Equipment Recommendations  3in1 (PT)    Recommendations for Other Services       Precautions / Restrictions Precautions Precautions: Fall;Back Precaution Booklet Issued: Yes (comment) Precaution Comments: Reviewed handout. Pt was cued for precautions during functional mobility.  Required Braces or Orthoses: Spinal Brace Spinal Brace: Lumbar corset;Applied in sitting position Restrictions Weight Bearing Restrictions: No      Mobility  Bed Mobility Overal bed mobility: Needs Assistance Bed Mobility: Rolling;Sidelying to Sit Rolling: Min guard Sidelying to sit: Mod assist       General bed mobility comments: VC's for sequencing and log roll technique. Pt was able to transition to EOB with mod assist for trunk elevation to full sitting position. Pt mainly limited by pain.   Transfers Overall transfer level: Needs assistance Equipment used: None Transfers: Sit to/from Stand Sit to Stand: Min assist;From  elevated surface         General transfer comment: Assist to power-up to full standing position. Due to body habitus, it was easier for pt to push up from thighs at times.   Ambulation/Gait Ambulation/Gait assistance: Supervision Ambulation Distance (Feet): 450 Feet Assistive device: Rolling walker (2 wheeled) Gait Pattern/deviations: Step-through pattern;Decreased stride length;Trunk flexed;Wide base of support Gait velocity: Decreased Gait velocity interpretation: Below normal speed for age/gender General Gait Details: VC's for general safety with mobility. Pt moving slow but generally steady. No overt LOB noted.   Stairs Stairs: Yes Stairs assistance: Min assist Stair Management: One rail Right;Step to pattern;Forwards Number of Stairs: 5 General stair comments: VC's for sequencing and general safety with stair negotiation.  Wheelchair Mobility    Modified Rankin (Stroke Patients Only)       Balance Overall balance assessment: Needs assistance Sitting-balance support: Feet supported;No upper extremity supported Sitting balance-Leahy Scale: Fair     Standing balance support: No upper extremity supported;During functional activity Standing balance-Leahy Scale: Fair                               Pertinent Vitals/Pain Pain Assessment: Faces Faces Pain Scale: Hurts even more Pain Location: Incision site; RLE Pain Descriptors / Indicators: Operative site guarding;Discomfort Pain Intervention(s): Limited activity within patient's tolerance;Monitored during session;Repositioned    Home Living Family/patient expects to be discharged to:: Private residence Living Arrangements: Spouse/significant other Available Help at Discharge: Family;Available 24 hours/day (Until monday) Type of Home: Mobile home Home Access: Stairs to enter Entrance Stairs-Rails: Right;Left Entrance Stairs-Number of Steps: 4 Home Layout: One level Home Equipment: None  Prior  Function Level of Independence: Independent               Hand Dominance   Dominant Hand: Right    Extremity/Trunk Assessment   Upper Extremity Assessment Upper Extremity Assessment: Defer to OT evaluation    Lower Extremity Assessment Lower Extremity Assessment: RLE deficits/detail RLE Deficits / Details: Decreased strength consistent with pre-op diagnosis    Cervical / Trunk Assessment Cervical / Trunk Assessment: Other exceptions Cervical / Trunk Exceptions: s/p surgery  Communication   Communication: No difficulties  Cognition Arousal/Alertness: Awake/alert Behavior During Therapy: WFL for tasks assessed/performed Overall Cognitive Status: Within Functional Limits for tasks assessed                                        General Comments      Exercises     Assessment/Plan    PT Assessment Patient needs continued PT services  PT Problem List Decreased strength;Decreased range of motion;Decreased balance;Decreased activity tolerance;Decreased mobility;Decreased safety awareness;Decreased knowledge of use of DME;Decreased knowledge of precautions;Pain       PT Treatment Interventions DME instruction;Stair training;Gait training;Functional mobility training;Therapeutic activities;Therapeutic exercise;Neuromuscular re-education;Patient/family education    PT Goals (Current goals can be found in the Care Plan section)  Acute Rehab PT Goals Patient Stated Goal: Home at d/c PT Goal Formulation: With patient/family Time For Goal Achievement: 02/28/17 Potential to Achieve Goals: Good    Frequency Min 5X/week   Barriers to discharge        Co-evaluation               AM-PAC PT "6 Clicks" Daily Activity  Outcome Measure Difficulty turning over in bed (including adjusting bedclothes, sheets and blankets)?: A Little Difficulty moving from lying on back to sitting on the side of the bed? : A Little Difficulty sitting down on and standing  up from a chair with arms (e.g., wheelchair, bedside commode, etc,.)?: A Little Help needed moving to and from a bed to chair (including a wheelchair)?: A Little Help needed walking in hospital room?: A Little Help needed climbing 3-5 steps with a railing? : A Little 6 Click Score: 18    End of Session Equipment Utilized During Treatment: Gait belt;Back brace Activity Tolerance: Patient tolerated treatment well Patient left: in chair;with call bell/phone within reach;with family/visitor present Nurse Communication: Mobility status PT Visit Diagnosis: Unsteadiness on feet (R26.81);Pain;Other symptoms and signs involving the nervous system (R29.898) Pain - Right/Left: Right Pain - part of body: Leg (and back)    Time: 4742-5956 PT Time Calculation (min) (ACUTE ONLY): 44 min   Charges:   PT Evaluation $PT Eval Moderate Complexity: 1 Mod PT Treatments $Gait Training: 23-37 mins   PT G Codes:        Rolinda Roan, PT, DPT Acute Rehabilitation Services Pager: [place] 02/21/2017, 9:46 AM

## 2017-04-08 DIAGNOSIS — M4316 Spondylolisthesis, lumbar region: Secondary | ICD-10-CM | POA: Diagnosis not present

## 2017-04-24 DIAGNOSIS — F411 Generalized anxiety disorder: Secondary | ICD-10-CM | POA: Diagnosis not present

## 2017-04-24 DIAGNOSIS — I10 Essential (primary) hypertension: Secondary | ICD-10-CM | POA: Diagnosis not present

## 2017-04-24 DIAGNOSIS — Z23 Encounter for immunization: Secondary | ICD-10-CM | POA: Diagnosis not present

## 2017-04-24 DIAGNOSIS — F339 Major depressive disorder, recurrent, unspecified: Secondary | ICD-10-CM | POA: Diagnosis not present

## 2017-05-29 DIAGNOSIS — I10 Essential (primary) hypertension: Secondary | ICD-10-CM | POA: Diagnosis not present

## 2017-06-30 DIAGNOSIS — M4316 Spondylolisthesis, lumbar region: Secondary | ICD-10-CM | POA: Diagnosis not present

## 2017-07-11 DIAGNOSIS — R7301 Impaired fasting glucose: Secondary | ICD-10-CM | POA: Diagnosis not present

## 2017-07-11 DIAGNOSIS — I10 Essential (primary) hypertension: Secondary | ICD-10-CM | POA: Diagnosis not present

## 2017-07-11 DIAGNOSIS — E785 Hyperlipidemia, unspecified: Secondary | ICD-10-CM | POA: Diagnosis not present

## 2017-07-18 DIAGNOSIS — Z0000 Encounter for general adult medical examination without abnormal findings: Secondary | ICD-10-CM | POA: Diagnosis not present

## 2017-08-06 DIAGNOSIS — M9262 Juvenile osteochondrosis of tarsus, left ankle: Secondary | ICD-10-CM | POA: Diagnosis not present

## 2017-08-06 DIAGNOSIS — M9261 Juvenile osteochondrosis of tarsus, right ankle: Secondary | ICD-10-CM | POA: Diagnosis not present

## 2017-09-01 DIAGNOSIS — F411 Generalized anxiety disorder: Secondary | ICD-10-CM | POA: Diagnosis not present

## 2017-09-01 DIAGNOSIS — I10 Essential (primary) hypertension: Secondary | ICD-10-CM | POA: Diagnosis not present

## 2017-09-01 DIAGNOSIS — F339 Major depressive disorder, recurrent, unspecified: Secondary | ICD-10-CM | POA: Diagnosis not present

## 2017-12-08 DIAGNOSIS — I10 Essential (primary) hypertension: Secondary | ICD-10-CM | POA: Diagnosis not present

## 2017-12-27 IMAGING — MR MR LUMBAR SPINE W/O CM
4 of 5 series · 14 of 48 positions shown · non-contrast
Comparison: None available.

CLINICAL DATA: Initial evaluation for low back pain radiating down
both lower extremities.

EXAM:
MRI LUMBAR SPINE WITHOUT CONTRAST
TECHNIQUE: Multiplanar, multisequence MR imaging of the lumbar spine was
performed. No intravenous contrast was administered.

[Series 3: T2 · sagittal · 4.0mm · 0.68mm/px · 5 of 15 slices shown (1 of 2)]
[im 1/15]
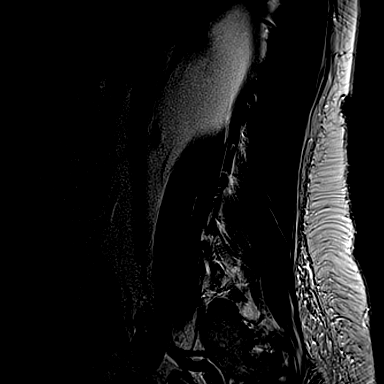
[im 3/15]
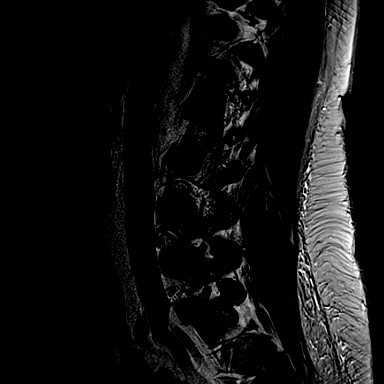
[im 6/15]
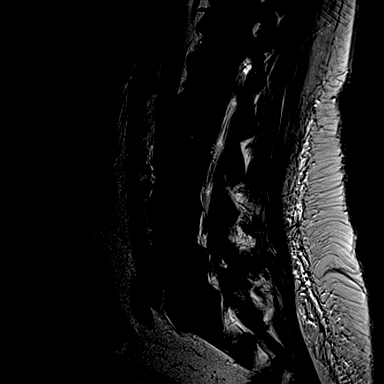
[im 9/15]
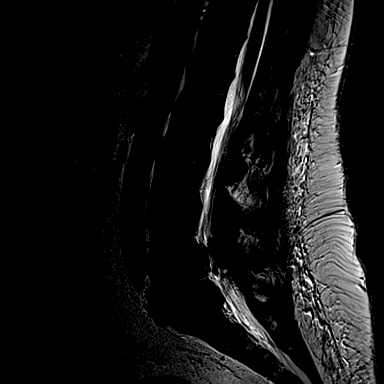
[im 15/15]
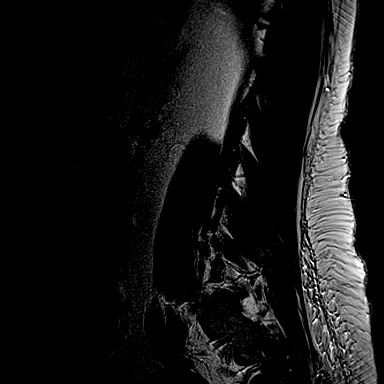

[Series 4: T1 · sagittal · 4.0mm · 0.34mm/px · 3 of 15 slices shown (1 of 2)]
[im 3/15]
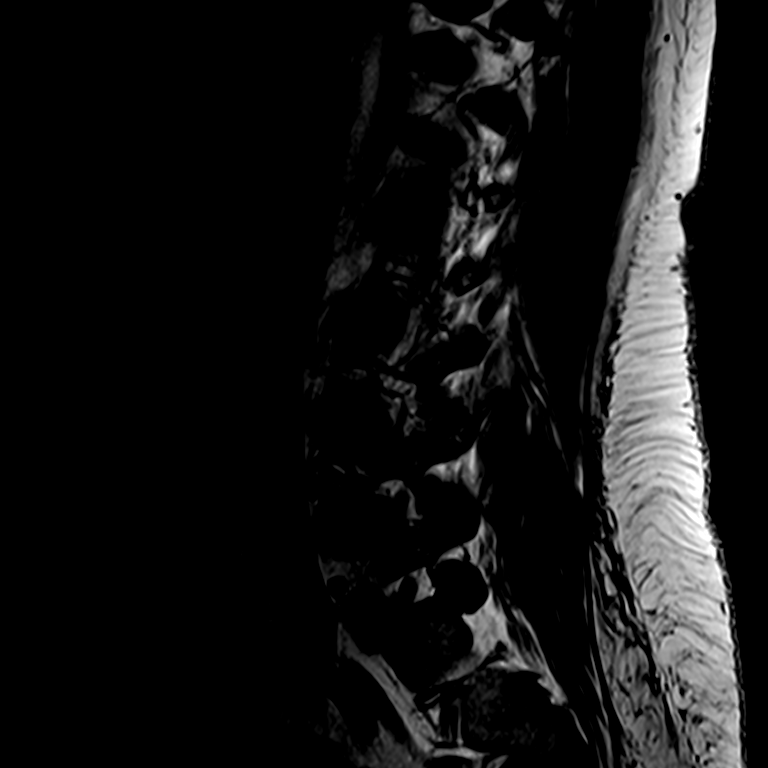
[im 9/15]
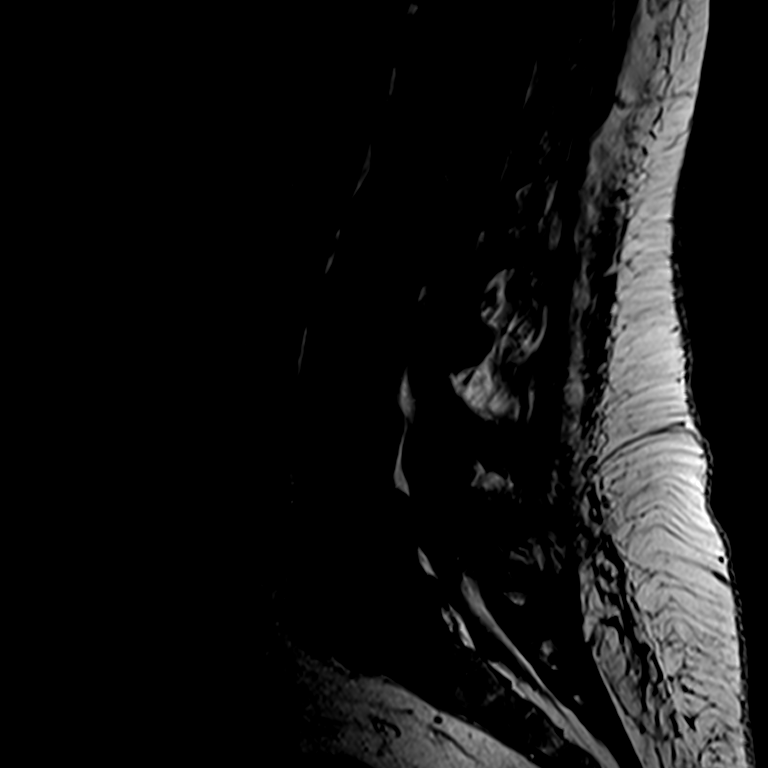
[im 15/15]
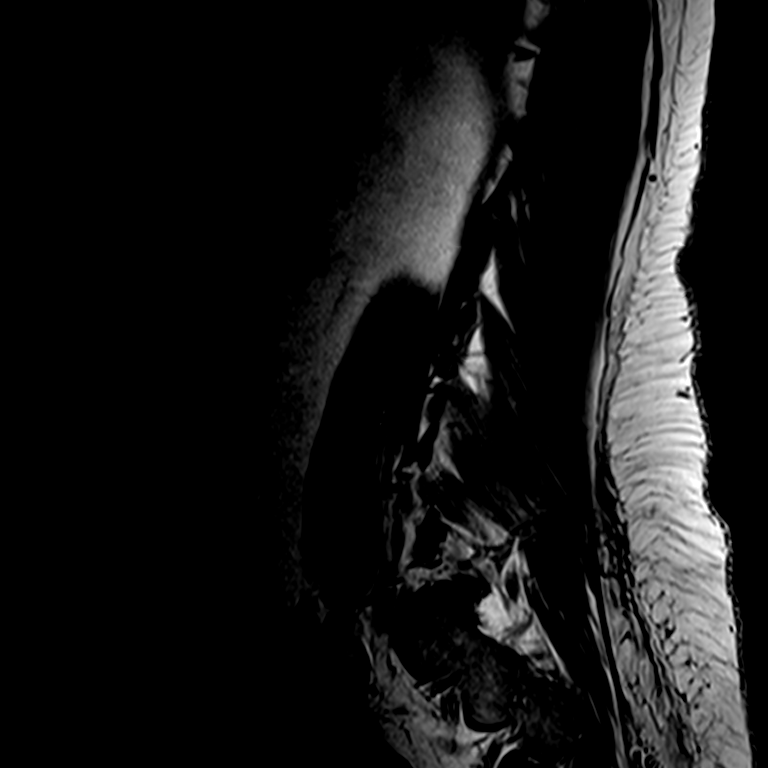

[Series 6: T2 · axial · 4.0mm · 0.23mm/px · z∈[-69,+62]mm · 3 of 38 slices shown (2 of 2)]
[im 6/38]
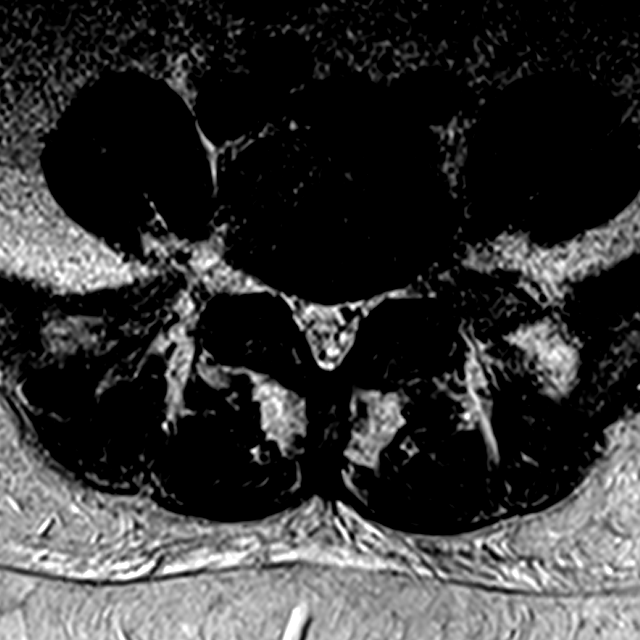
[im 19/38]
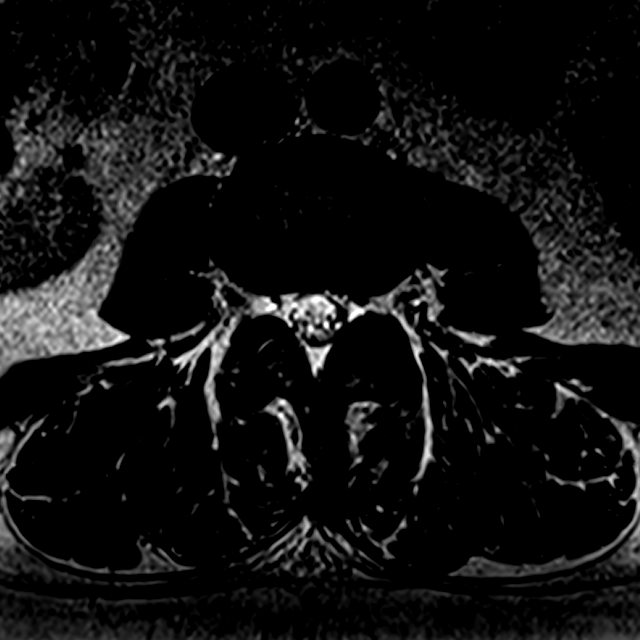
[im 32/38]
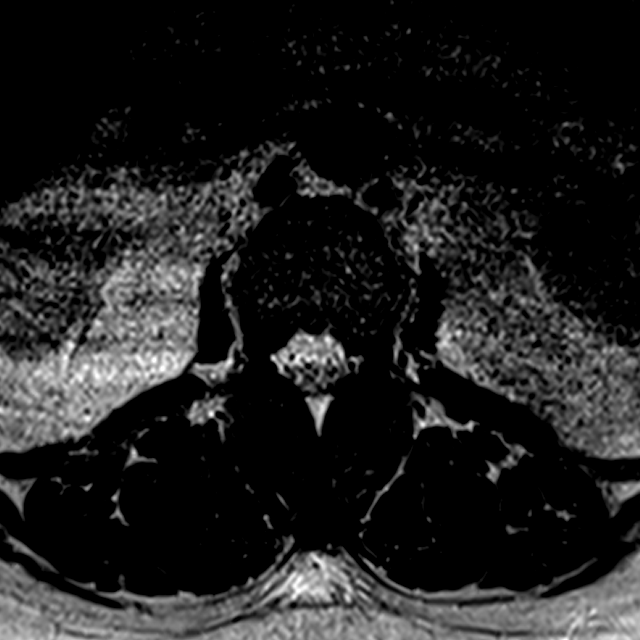

[Series 7: T1 · axial · 4.0mm · 0.23mm/px · z∈[-69,+62]mm · 3 of 38 slices shown (2 of 2)]
[im 6/38]
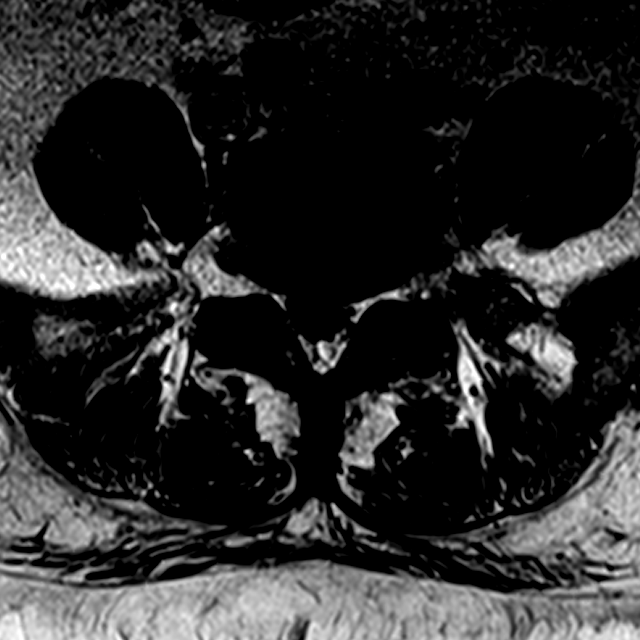
[im 19/38]
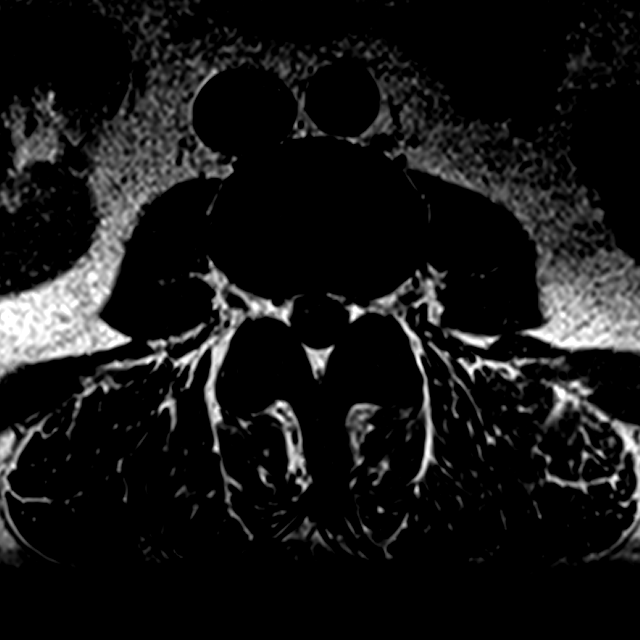
[im 32/38]
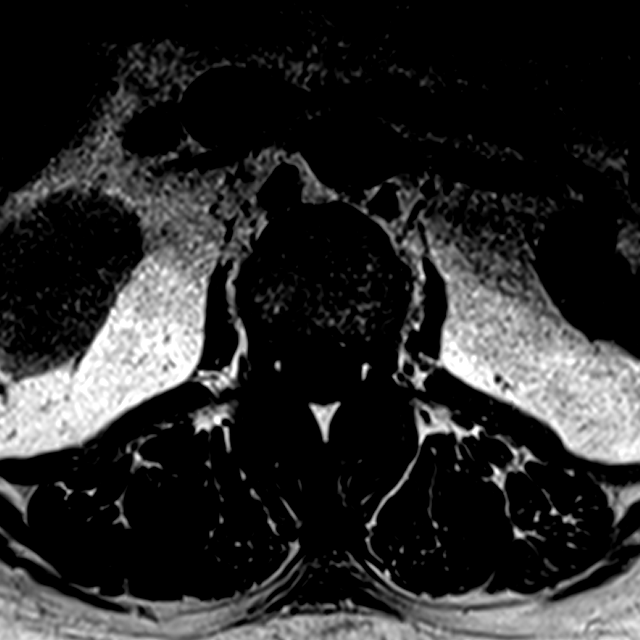

[14 of 48 positions shown; findings below may reference images not displayed]

FINDINGS: Segmentation: Normal segmentation. Lowest well-formed disc is
labeled the L5-S1 level.

Alignment: 5 mm anterolisthesis of L4 on L5. No associated pars
defect. Vertebral bodies otherwise normally aligned with
preservation of the normal lumbar lordosis.

Vertebrae: Vertebral body heights are maintained. No evidence for
acute or chronic fracture. Signal intensity within the vertebral
body bone marrow is normal. No discrete osseous lesions.

Conus medullaris: Extends to the L1 level and appears normal.

Paraspinal and other soft tissues: Paraspinous soft tissues within
normal limits. Visualized visceral structures are unremarkable.

Disc levels:

T11-12: Seen only on sagittal projection. Normal disc. Posterior
element hypertrophy (series 3, image 7). No stenosis.

T12-L1:  Unremarkable.

L1-2: Small central disc protrusion with slight caudad angulation
(series 6, image 5). Mild facet and ligamentum flavum hypertrophy.
No significant canal or neural foraminal stenosis.

L2-3: Normal interspace. Facet and ligamentum flavum hypertrophy. No
canal or neural foraminal stenosis.

L3-4: No significant disc bulge. Annular fissure noted anteriorly
(series 3, image 8). Mild facet hypertrophy. No significant canal or
neural foraminal stenosis.

L4-5: 5 mm anterolisthesis of L4 on L5. Associated diffuse disc
bulge with disc desiccation. No focal disc herniation. Moderate
bilateral facet arthrosis with prominent reactive effusions within
the bilateral L4-5 facets. Ligamentum flavum hypertrophy. Possible
tiny 3 mm synovial cyst at the medial aspect of the left L4-5 facet
(series 6, image 28). Resultant moderate canal and bilateral
subarticular stenosis, slightly worse on the left. No significant
foraminal encroachment.

L5-S1: Diffuse degenerative disc bulge with disc desiccation. Broad
posterior disc protrusion indents the ventral thecal sac, closely
approximating the transiting S1 nerve roots, slightly greater on the
right. No frank neural impingement. Epidural lipomatosis. Mild facet
hypertrophy. No significant canal or neural foraminal stenosis.
IMPRESSION: 1. 5 mm anterolisthesis of L4 on L5 with associated disc bulge and
prominent facet arthrosis, resulting in moderate canal and bilateral
subarticular stenosis.
2. Broad central disc protrusion at L5-S1 without significant
stenosis or neural impingement.
3. Small central disc protrusion at L1-2 without stenosis.

## 2018-01-16 DIAGNOSIS — I10 Essential (primary) hypertension: Secondary | ICD-10-CM | POA: Diagnosis not present

## 2018-01-16 DIAGNOSIS — E785 Hyperlipidemia, unspecified: Secondary | ICD-10-CM | POA: Diagnosis not present

## 2018-01-16 DIAGNOSIS — R7301 Impaired fasting glucose: Secondary | ICD-10-CM | POA: Diagnosis not present

## 2018-01-22 DIAGNOSIS — Z981 Arthrodesis status: Secondary | ICD-10-CM | POA: Diagnosis not present

## 2018-01-28 DIAGNOSIS — M9262 Juvenile osteochondrosis of tarsus, left ankle: Secondary | ICD-10-CM | POA: Diagnosis not present

## 2018-02-25 DIAGNOSIS — M7661 Achilles tendinitis, right leg: Secondary | ICD-10-CM | POA: Diagnosis not present

## 2018-04-10 ENCOUNTER — Ambulatory Visit (HOSPITAL_COMMUNITY)
Admission: RE | Admit: 2018-04-10 | Discharge: 2018-04-10 | Disposition: A | Discharge status: Home / Self Care | Payer: BLUE CROSS/BLUE SHIELD | Source: Ambulatory Visit | Attending: Internal Medicine | Admitting: Internal Medicine

## 2018-04-10 ENCOUNTER — Encounter (HOSPITAL_COMMUNITY): Payer: Self-pay

## 2018-04-10 ENCOUNTER — Other Ambulatory Visit (HOSPITAL_COMMUNITY): Payer: Self-pay | Admitting: Internal Medicine

## 2018-04-10 DIAGNOSIS — R062 Wheezing: Secondary | ICD-10-CM | POA: Insufficient documentation

## 2018-04-10 DIAGNOSIS — R0602 Shortness of breath: Secondary | ICD-10-CM | POA: Diagnosis not present

## 2018-04-10 DIAGNOSIS — R05 Cough: Secondary | ICD-10-CM | POA: Diagnosis not present

## 2018-04-10 DIAGNOSIS — R059 Cough, unspecified: Secondary | ICD-10-CM

## 2018-04-10 DIAGNOSIS — J060 Acute laryngopharyngitis: Secondary | ICD-10-CM | POA: Diagnosis not present

## 2018-04-28 DIAGNOSIS — J019 Acute sinusitis, unspecified: Secondary | ICD-10-CM | POA: Diagnosis not present

## 2018-04-28 DIAGNOSIS — R682 Dry mouth, unspecified: Secondary | ICD-10-CM | POA: Diagnosis not present

## 2018-04-28 DIAGNOSIS — M791 Myalgia, unspecified site: Secondary | ICD-10-CM | POA: Diagnosis not present

## 2018-04-28 DIAGNOSIS — L732 Hidradenitis suppurativa: Secondary | ICD-10-CM | POA: Diagnosis not present

## 2018-05-08 ENCOUNTER — Ambulatory Visit (HOSPITAL_COMMUNITY)
Admission: RE | Admit: 2018-05-08 | Discharge: 2018-05-08 | Disposition: A | Discharge status: Home / Self Care | Payer: BLUE CROSS/BLUE SHIELD | Source: Ambulatory Visit | Attending: Internal Medicine | Admitting: Internal Medicine

## 2018-05-08 ENCOUNTER — Other Ambulatory Visit (HOSPITAL_COMMUNITY): Payer: Self-pay | Admitting: Internal Medicine

## 2018-05-08 ENCOUNTER — Encounter (HOSPITAL_COMMUNITY): Payer: Self-pay

## 2018-05-08 DIAGNOSIS — J189 Pneumonia, unspecified organism: Secondary | ICD-10-CM | POA: Insufficient documentation

## 2018-05-08 DIAGNOSIS — R05 Cough: Secondary | ICD-10-CM | POA: Diagnosis not present

## 2018-05-25 DIAGNOSIS — R7301 Impaired fasting glucose: Secondary | ICD-10-CM | POA: Diagnosis not present

## 2018-05-25 DIAGNOSIS — I10 Essential (primary) hypertension: Secondary | ICD-10-CM | POA: Diagnosis not present

## 2018-05-25 DIAGNOSIS — E785 Hyperlipidemia, unspecified: Secondary | ICD-10-CM | POA: Diagnosis not present

## 2018-05-25 DIAGNOSIS — R748 Abnormal levels of other serum enzymes: Secondary | ICD-10-CM | POA: Diagnosis not present

## 2018-05-28 DIAGNOSIS — E782 Mixed hyperlipidemia: Secondary | ICD-10-CM | POA: Diagnosis not present

## 2018-05-28 DIAGNOSIS — I10 Essential (primary) hypertension: Secondary | ICD-10-CM | POA: Diagnosis not present

## 2018-05-28 DIAGNOSIS — L732 Hidradenitis suppurativa: Secondary | ICD-10-CM | POA: Diagnosis not present

## 2018-05-28 DIAGNOSIS — R7301 Impaired fasting glucose: Secondary | ICD-10-CM | POA: Diagnosis not present

## 2018-07-02 DIAGNOSIS — L719 Rosacea, unspecified: Secondary | ICD-10-CM | POA: Diagnosis not present

## 2018-07-02 DIAGNOSIS — M2550 Pain in unspecified joint: Secondary | ICD-10-CM | POA: Diagnosis not present

## 2018-07-02 DIAGNOSIS — M5126 Other intervertebral disc displacement, lumbar region: Secondary | ICD-10-CM | POA: Diagnosis not present

## 2018-08-21 DIAGNOSIS — R7303 Prediabetes: Secondary | ICD-10-CM | POA: Diagnosis not present

## 2018-08-21 DIAGNOSIS — F411 Generalized anxiety disorder: Secondary | ICD-10-CM | POA: Diagnosis not present

## 2018-08-21 DIAGNOSIS — I10 Essential (primary) hypertension: Secondary | ICD-10-CM | POA: Diagnosis not present

## 2018-08-21 DIAGNOSIS — E785 Hyperlipidemia, unspecified: Secondary | ICD-10-CM | POA: Diagnosis not present

## 2018-12-04 DIAGNOSIS — E785 Hyperlipidemia, unspecified: Secondary | ICD-10-CM | POA: Diagnosis not present

## 2018-12-04 DIAGNOSIS — R7303 Prediabetes: Secondary | ICD-10-CM | POA: Diagnosis not present

## 2018-12-04 DIAGNOSIS — I10 Essential (primary) hypertension: Secondary | ICD-10-CM | POA: Diagnosis not present

## 2018-12-04 DIAGNOSIS — R7301 Impaired fasting glucose: Secondary | ICD-10-CM | POA: Diagnosis not present

## 2018-12-07 DIAGNOSIS — E782 Mixed hyperlipidemia: Secondary | ICD-10-CM | POA: Diagnosis not present

## 2018-12-07 DIAGNOSIS — R7301 Impaired fasting glucose: Secondary | ICD-10-CM | POA: Diagnosis not present

## 2018-12-07 DIAGNOSIS — Z0001 Encounter for general adult medical examination with abnormal findings: Secondary | ICD-10-CM | POA: Diagnosis not present

## 2018-12-07 DIAGNOSIS — I10 Essential (primary) hypertension: Secondary | ICD-10-CM | POA: Diagnosis not present

## 2018-12-10 ENCOUNTER — Other Ambulatory Visit: Payer: Self-pay

## 2018-12-10 ENCOUNTER — Other Ambulatory Visit (HOSPITAL_COMMUNITY): Payer: Self-pay | Admitting: Family Medicine

## 2018-12-10 ENCOUNTER — Ambulatory Visit (HOSPITAL_COMMUNITY)
Admission: RE | Admit: 2018-12-10 | Discharge: 2018-12-10 | Disposition: A | Discharge status: Home / Self Care | Payer: BC Managed Care – PPO | Source: Ambulatory Visit | Attending: Family Medicine | Admitting: Family Medicine

## 2018-12-10 DIAGNOSIS — M25762 Osteophyte, left knee: Secondary | ICD-10-CM | POA: Diagnosis not present

## 2018-12-10 DIAGNOSIS — M25562 Pain in left knee: Secondary | ICD-10-CM

## 2018-12-23 DIAGNOSIS — M25562 Pain in left knee: Secondary | ICD-10-CM | POA: Diagnosis not present

## 2018-12-23 DIAGNOSIS — M1712 Unilateral primary osteoarthritis, left knee: Secondary | ICD-10-CM | POA: Diagnosis not present

## 2018-12-23 DIAGNOSIS — S93492A Sprain of other ligament of left ankle, initial encounter: Secondary | ICD-10-CM | POA: Diagnosis not present

## 2019-03-09 DIAGNOSIS — J302 Other seasonal allergic rhinitis: Secondary | ICD-10-CM | POA: Diagnosis not present

## 2019-03-09 DIAGNOSIS — G4700 Insomnia, unspecified: Secondary | ICD-10-CM | POA: Diagnosis not present

## 2019-03-09 DIAGNOSIS — I10 Essential (primary) hypertension: Secondary | ICD-10-CM | POA: Diagnosis not present

## 2019-03-09 DIAGNOSIS — M545 Low back pain: Secondary | ICD-10-CM | POA: Diagnosis not present

## 2019-04-28 DIAGNOSIS — I10 Essential (primary) hypertension: Secondary | ICD-10-CM | POA: Diagnosis not present

## 2019-04-28 DIAGNOSIS — E785 Hyperlipidemia, unspecified: Secondary | ICD-10-CM | POA: Diagnosis not present

## 2019-04-28 DIAGNOSIS — R7301 Impaired fasting glucose: Secondary | ICD-10-CM | POA: Diagnosis not present

## 2019-04-28 DIAGNOSIS — E782 Mixed hyperlipidemia: Secondary | ICD-10-CM | POA: Diagnosis not present

## 2019-04-28 DIAGNOSIS — R945 Abnormal results of liver function studies: Secondary | ICD-10-CM | POA: Diagnosis not present

## 2019-05-03 DIAGNOSIS — R7301 Impaired fasting glucose: Secondary | ICD-10-CM | POA: Diagnosis not present

## 2019-05-03 DIAGNOSIS — R945 Abnormal results of liver function studies: Secondary | ICD-10-CM | POA: Diagnosis not present

## 2019-05-03 DIAGNOSIS — E782 Mixed hyperlipidemia: Secondary | ICD-10-CM | POA: Diagnosis not present

## 2019-05-03 DIAGNOSIS — I10 Essential (primary) hypertension: Secondary | ICD-10-CM | POA: Diagnosis not present

## 2019-10-14 ENCOUNTER — Ambulatory Visit: Payer: BC Managed Care – PPO

## 2019-10-14 DIAGNOSIS — Z23 Encounter for immunization: Secondary | ICD-10-CM

## 2019-10-14 NOTE — Progress Notes (Signed)
   Covid-19 Vaccination Clinic  Name:  Erin Vaughn    MRN: [id number] DOB: 11-Apr-1971  10/14/2019  [name] was observed post Covid-19 immunization for 15 minutes without incident. She was provided with Vaccine Information Sheet and instruction to access the V-Safe system.   [name] was instructed to call 911 with any severe reactions post vaccine: Marland Kitchen Difficulty breathing  . Swelling of face and throat  . A fast heartbeat  . A bad rash all over body  . Dizziness and weakness   Immunizations Administered    Name Date Dose VIS Date Route   Moderna COVID-19 Vaccine 10/14/2019  3:43 PM 0.5 mL 04/2019 Intramuscular   Manufacturer: Moderna   Lot: [id number]   [id number]

## 2019-11-11 ENCOUNTER — Ambulatory Visit: Payer: Self-pay | Attending: Internal Medicine

## 2019-11-11 DIAGNOSIS — Z23 Encounter for immunization: Secondary | ICD-10-CM

## 2019-11-11 NOTE — Progress Notes (Signed)
   Covid-19 Vaccination Clinic  Name:  Erin Vaughn    MRN: [id number] DOB: 04-Dec-1970  11/11/2019  [name] was observed post Covid-19 immunization for 30 minutes based on pre-vaccination screening without incident. She was provided with Vaccine Information Sheet and instruction to access the V-Safe system.   [name] was instructed to call 911 with any severe reactions post vaccine: Marland Kitchen Difficulty breathing  . Swelling of face and throat  . A fast heartbeat  . A bad rash all over body  . Dizziness and weakness   Immunizations Administered    Name Date Dose VIS Date Route   Moderna COVID-19 Vaccine 11/11/2019  4:41 PM 0.5 mL 04/2019 Intramuscular   Manufacturer: Moderna   Lot: [id number]   [place]: [id number]

## 2020-01-20 DIAGNOSIS — G4733 Obstructive sleep apnea (adult) (pediatric): Secondary | ICD-10-CM | POA: Insufficient documentation

## 2020-01-27 DIAGNOSIS — E559 Vitamin D deficiency, unspecified: Secondary | ICD-10-CM | POA: Insufficient documentation

## 2020-01-27 DIAGNOSIS — F17200 Nicotine dependence, unspecified, uncomplicated: Secondary | ICD-10-CM | POA: Insufficient documentation

## 2020-01-27 DIAGNOSIS — E781 Pure hyperglyceridemia: Secondary | ICD-10-CM | POA: Insufficient documentation

## 2020-01-27 DIAGNOSIS — R7303 Prediabetes: Secondary | ICD-10-CM | POA: Insufficient documentation

## 2020-05-18 ENCOUNTER — Ambulatory Visit: Payer: 59 | Attending: Internal Medicine

## 2020-05-18 DIAGNOSIS — Z23 Encounter for immunization: Secondary | ICD-10-CM

## 2020-05-18 NOTE — Progress Notes (Signed)
   Covid-19 Vaccination Clinic  Name:  Erin Vaughn    MRN: [id number] DOB: [birth year]  05/18/2020  Erin Vaughn was observed post Covid-19 immunization for 30 minutes based on pre-vaccination screening without incident. She was provided with Vaccine Information Sheet and instruction to access the V-Safe system.   Erin Vaughn was instructed to call 911 with any severe reactions post vaccine: Marland Kitchen Difficulty breathing  . Swelling of face and throat  . A fast heartbeat  . A bad rash all over body  . Dizziness and weakness   Immunizations Administered    Name Date Dose VIS Date Route   Moderna Covid-19 Booster Vaccine 05/18/2020  2:22 PM 0.25 mL 03/08/2020 Intramuscular   Manufacturer: Moderna   Lot: [id number]   NDC: 50388-828-00

## 2020-07-04 DIAGNOSIS — Z87891 Personal history of nicotine dependence: Secondary | ICD-10-CM | POA: Insufficient documentation

## 2020-07-19 DIAGNOSIS — R112 Nausea with vomiting, unspecified: Secondary | ICD-10-CM | POA: Insufficient documentation

## 2020-11-01 DIAGNOSIS — I10 Essential (primary) hypertension: Secondary | ICD-10-CM | POA: Insufficient documentation

## 2020-11-01 DIAGNOSIS — K219 Gastro-esophageal reflux disease without esophagitis: Secondary | ICD-10-CM | POA: Insufficient documentation

## 2020-11-05 DIAGNOSIS — F411 Generalized anxiety disorder: Secondary | ICD-10-CM | POA: Insufficient documentation

## 2020-11-05 DIAGNOSIS — G8929 Other chronic pain: Secondary | ICD-10-CM | POA: Insufficient documentation

## 2020-11-05 DIAGNOSIS — R7989 Other specified abnormal findings of blood chemistry: Secondary | ICD-10-CM | POA: Insufficient documentation

## 2020-11-05 DIAGNOSIS — J302 Other seasonal allergic rhinitis: Secondary | ICD-10-CM | POA: Insufficient documentation

## 2020-11-06 DIAGNOSIS — L732 Hidradenitis suppurativa: Secondary | ICD-10-CM | POA: Insufficient documentation

## 2020-11-06 DIAGNOSIS — L600 Ingrowing nail: Secondary | ICD-10-CM | POA: Insufficient documentation

## 2020-11-06 DIAGNOSIS — D509 Iron deficiency anemia, unspecified: Secondary | ICD-10-CM | POA: Insufficient documentation

## 2020-11-14 ENCOUNTER — Ambulatory Visit (INDEPENDENT_AMBULATORY_CARE_PROVIDER_SITE_OTHER): Payer: BLUE CROSS/BLUE SHIELD | Admitting: Podiatry

## 2020-11-14 ENCOUNTER — Other Ambulatory Visit: Payer: Self-pay

## 2020-11-14 ENCOUNTER — Encounter: Payer: Self-pay | Admitting: Podiatry

## 2020-11-14 DIAGNOSIS — L600 Ingrowing nail: Secondary | ICD-10-CM | POA: Diagnosis not present

## 2020-11-14 NOTE — Progress Notes (Signed)
  Subjective:  Patient ID: Erin Vaughn, female    DOB: 1971/04/10,  MRN: [id number]  Chief Complaint  Patient presents with   Ingrown Toenail      np - ingrown, left    50 y.o. female presents with the above complaint. History confirmed with patient.  Becoming more painful has not been infected or red or swollen or draining  Objective:  Physical Exam: warm, good capillary refill, no trophic changes or ulcerative lesions, normal DP and PT pulses, and normal sensory exam. Left Foot: Painful ingrowing medial nail border no paronychia Assessment:   1. Ingrowing left great toenail      Plan:  Patient was evaluated and treated and all questions answered.  Recommended permanent partial nail avulsion which I discussed with her.  [place] the rest will be until she is back from her vacation to do this.  She will follow-up me in 1 month.  Return in about 1 month (around 12/14/2020) for ingrown toenail check and removal.

## 2020-12-19 ENCOUNTER — Ambulatory Visit: Payer: BLUE CROSS/BLUE SHIELD | Admitting: Podiatry

## 2021-02-08 ENCOUNTER — Ambulatory Visit (INDEPENDENT_AMBULATORY_CARE_PROVIDER_SITE_OTHER): Payer: 59 | Admitting: Podiatry

## 2021-02-08 ENCOUNTER — Other Ambulatory Visit: Payer: Self-pay

## 2021-02-08 DIAGNOSIS — M21622 Bunionette of left foot: Secondary | ICD-10-CM | POA: Diagnosis not present

## 2021-02-13 ENCOUNTER — Encounter: Payer: Self-pay | Admitting: Podiatry

## 2021-02-13 NOTE — Progress Notes (Signed)
  Subjective:  Patient ID: Erin Vaughn, female    DOB: 08/05/1970,  MRN: [id number]  Chief Complaint  Patient presents with   Follow-up    Ingrown follow up- doing better-  Concern with the callus on 5th toe- put a corn removal patch on it and it loosen the skin     50 y.o. female presents with the above complaint. History confirmed with patient.  The toenail from the procedure is doing well she had a spot on the outside of the fifth toe that she applied a corn remover pad and has become very inflamed and sore  Objective:  Physical Exam: warm, good capillary refill, no trophic changes or ulcerative lesions, normal DP and PT pulses, and normal sensory exam. Left Foot: she has a tailor's bunion with hyperkeratosis plantarly and laterally with inflamed tissue Assessment:   1. Tailor's bunion of left foot       Plan:  Patient was evaluated and treated and all questions answered.  Discussed etiology of the painful skin lesion she has and how this is secondary to the tailor's bunion.  I recommended she avoid any further salicylic acid at this point I dispensed a silicone tailor's bunion pad.  We will reevaluate at next visit if it still painful take x-rays to evaluate for tailor's bunionectomy if necessary.  No follow-ups on file.

## 2021-03-12 ENCOUNTER — Ambulatory Visit: Payer: 59 | Admitting: Podiatry

## 2021-03-20 ENCOUNTER — Ambulatory Visit: Payer: 59 | Admitting: Podiatry

## 2021-04-10 ENCOUNTER — Other Ambulatory Visit: Payer: Self-pay

## 2021-04-10 ENCOUNTER — Ambulatory Visit (INDEPENDENT_AMBULATORY_CARE_PROVIDER_SITE_OTHER): Payer: 59 | Admitting: Podiatry

## 2021-04-10 DIAGNOSIS — L600 Ingrowing nail: Secondary | ICD-10-CM | POA: Diagnosis not present

## 2021-04-10 NOTE — Progress Notes (Signed)
  Subjective:  Patient ID: Erin Vaughn, female    DOB: Apr 15, 1971,  MRN: [id number]  Chief Complaint  Patient presents with   Ingrown Toenail      ingrown check and removal    50 y.o. female presents with the above complaint. History confirmed with patient.  Toenail is still sore the toe feels somewhat numb  Objective:  Physical Exam: warm, good capillary refill, no trophic changes or ulcerative lesions, normal DP and PT pulses, and normal sensory exam. Left Foot: Today's bunion and hallux valgus deformity, ingrown toenail medial border without paronychia Assessment:   1. Ingrowing left great toenail       Plan:  Patient was evaluated and treated and all questions answered.  Again discussed permanent partial nail avulsion.  She would like to plan for this in a few weeks and we will do this before Christmas.  We also discussed how her bunion deformity contributes to the pain here.  Currently the bone itself is not painful.  If worsening will take x-rays and reevaluate  Return in 1 month (on 05/10/2021) for ingrown nail removal .

## 2021-05-10 ENCOUNTER — Ambulatory Visit: Payer: 59 | Admitting: Podiatry

## 2021-05-15 ENCOUNTER — Other Ambulatory Visit (HOSPITAL_COMMUNITY): Payer: Self-pay | Admitting: Family Medicine

## 2021-05-15 DIAGNOSIS — Z1231 Encounter for screening mammogram for malignant neoplasm of breast: Secondary | ICD-10-CM

## 2021-06-12 ENCOUNTER — Ambulatory Visit: Payer: 59 | Admitting: Podiatry

## 2021-06-12 ENCOUNTER — Other Ambulatory Visit: Payer: Self-pay

## 2021-06-12 DIAGNOSIS — L600 Ingrowing nail: Secondary | ICD-10-CM | POA: Diagnosis not present

## 2021-06-12 MED ORDER — NEOMYCIN-POLYMYXIN-HC 1 % OT SOLN: OTIC | 0 refills | Status: DC

## 2021-06-12 NOTE — Patient Instructions (Signed)

## 2021-06-16 NOTE — Progress Notes (Signed)
°  Subjective:  Patient ID: Erin Vaughn, female    DOB: September 12, 1970,  MRN: [id number]  Chief Complaint  Patient presents with   Ingrown Toenail     ingrown removal    51 y.o. female presents with the above complaint. History confirmed with patient.  Here today for ingrown nail procedure  Objective:  Physical Exam: warm, good capillary refill, no trophic changes or ulcerative lesions, normal DP and PT pulses, and normal sensory exam. Left Foot: Today's bunion and hallux valgus deformity, ingrown toenail medial border without paronychia Assessment:   No diagnosis found.     Plan:  Patient was evaluated and treated and all questions answered.    Ingrown Nail, left great toenail -Patient elects to proceed with minor surgery to remove ingrown toenail today. Consent reviewed and signed by patient. -Ingrown nail excised. See procedure note. -Educated on post-procedure care including soaking. Written instructions provided and reviewed. -Patient to follow up in 2 weeks for nail check.  Procedure: Excision of Ingrown Toenail Location: Left 1st toe medial nail borders. Anesthesia: Lidocaine 1% plain; 1.5 mL and Marcaine 0.5% plain; 1.5 mL, digital block. Skin Prep: Betadine. Dressing: Silvadene; telfa; dry, sterile, compression dressing. Technique: Following skin prep, the toe was exsanguinated and a tourniquet was secured at the base of the toe. The affected nail border was freed, split with a nail splitter, and excised. Chemical matrixectomy was then performed with phenol and irrigated out with alcohol. The tourniquet was then removed and sterile dressing applied. Disposition: Patient tolerated procedure well. Patient to return in 2 weeks for follow-up.    Return in about 2 weeks (around 06/26/2021) for nail re-check.

## 2021-07-03 ENCOUNTER — Ambulatory Visit: Payer: 59 | Admitting: Podiatry

## 2021-07-03 ENCOUNTER — Other Ambulatory Visit: Payer: Self-pay

## 2021-07-03 DIAGNOSIS — L600 Ingrowing nail: Secondary | ICD-10-CM

## 2021-07-03 NOTE — Progress Notes (Signed)
°  Subjective:  Patient ID: Erin Vaughn, female    DOB: [birth year],  MRN: [id number]  Chief Complaint  Patient presents with   Ingrown Toenail    Nail check left    51 y.o. female presents with the above complaint. History confirmed with patient.  Doing well not having much pain or drainage at all  Objective:  Physical Exam: warm, good capillary refill, no trophic changes or ulcerative lesions, normal DP and PT pulses, and normal sensory exam. Left Foot: Ingrown nail matricectomy site is healing well Assessment:   1. Ingrowing left great toenail        Plan:  Patient was evaluated and treated and all questions answered.    Ingrown Nail, left great toenail -Doing very well can leave open to air and discontinue soaks.  I did debride some scab today but the rest should heal on its own.  I recommended urea gel for the nail and skin fold.  Return if symptoms worsen or fail to improve.

## 2021-07-03 NOTE — Patient Instructions (Signed)
Look for urea 40% gel and apply to the thickened dry skin / calluses. This can be bought over the counter, at a pharmacy or online such as [employer].

## 2021-07-05 ENCOUNTER — Telehealth: Payer: Self-pay | Admitting: Internal Medicine

## 2021-07-05 ENCOUNTER — Telehealth: Payer: Self-pay | Admitting: Oncology

## 2021-07-05 NOTE — Telephone Encounter (Signed)
Attempted to contact patient in regards of referral being sent to [place], but no answer. However, she is now scheduled with [facility] for 3/7 with Dr. Julien Nordmann. So she can disregard our phone call.

## 2021-07-05 NOTE — Telephone Encounter (Signed)
Scheduled appt per 2/15 referral. Pt is aware of appt date and time. Pt is aware to arrive 15 mins prior to appt time and to bring and updated insurance card. Pt is aware of appt location.   °

## 2021-07-24 ENCOUNTER — Inpatient Hospital Stay: Payer: 59

## 2021-07-24 ENCOUNTER — Other Ambulatory Visit: Payer: Self-pay | Admitting: Internal Medicine

## 2021-07-24 ENCOUNTER — Encounter: Payer: Self-pay | Admitting: Internal Medicine

## 2021-07-24 ENCOUNTER — Inpatient Hospital Stay: Payer: 59 | Attending: Internal Medicine | Admitting: Internal Medicine

## 2021-07-24 ENCOUNTER — Other Ambulatory Visit: Payer: Self-pay

## 2021-07-24 VITALS — BP 155/78 | HR 67 | Temp 96.9°F | Resp 18 | Wt 187.6 lb

## 2021-07-24 DIAGNOSIS — F17210 Nicotine dependence, cigarettes, uncomplicated: Secondary | ICD-10-CM | POA: Diagnosis not present

## 2021-07-24 DIAGNOSIS — I10 Essential (primary) hypertension: Secondary | ICD-10-CM | POA: Diagnosis not present

## 2021-07-24 DIAGNOSIS — Z79899 Other long term (current) drug therapy: Secondary | ICD-10-CM | POA: Insufficient documentation

## 2021-07-24 DIAGNOSIS — D509 Iron deficiency anemia, unspecified: Secondary | ICD-10-CM | POA: Diagnosis present

## 2021-07-24 DIAGNOSIS — D539 Nutritional anemia, unspecified: Secondary | ICD-10-CM

## 2021-07-24 LAB — FOLATE: Folate: 86.2 ng/mL (ref >5.9)

## 2021-07-24 LAB — CBC WITH DIFFERENTIAL (CANCER CENTER ONLY)
Abs Immature Granulocytes: 0.01 10*3/uL (ref 0.00–0.07)
Basophils Absolute: 0.1 10*3/uL (ref 0.0–0.1)
Basophils Relative: 1 %
Eosinophils Absolute: 0.4 10*3/uL (ref 0.0–0.5)
Eosinophils Relative: 5 %
HCT: 33.3 % — ABNORMAL LOW (ref 36.0–46.0)
Hemoglobin: 10.2 g/dL — ABNORMAL LOW (ref 12.0–15.0)
Immature Granulocytes: 0 %
Lymphocytes Relative: 42 %
Lymphs Abs: 3.3 10*3/uL (ref 0.7–4.0)
MCH: 23.9 pg — ABNORMAL LOW (ref 26.0–34.0)
MCHC: 30.6 g/dL (ref 30.0–36.0)
MCV: 78.2 fL — ABNORMAL LOW (ref 80.0–100.0)
Monocytes Absolute: 0.6 10*3/uL (ref 0.1–1.0)
Monocytes Relative: 8 %
Neutro Abs: 3.5 10*3/uL (ref 1.7–7.7)
Neutrophils Relative %: 44 %
Platelet Count: 317 10*3/uL (ref 150–400)
RBC: 4.26 MIL/uL (ref 3.87–5.11)
RDW: 17 % — ABNORMAL HIGH (ref 11.5–15.5)
WBC Count: 7.8 10*3/uL (ref 4.0–10.5)
nRBC: 0 % (ref 0.0–0.2)

## 2021-07-24 LAB — CMP (CANCER CENTER ONLY)
ALT: 30 U/L (ref 0–44)
AST: 27 U/L (ref 15–41)
Albumin: 4.3 g/dL (ref 3.5–5.0)
Alkaline Phosphatase: 121 U/L (ref 38–126)
Anion gap: 6 (ref 5–15)
BUN: 16 mg/dL (ref 6–20)
CO2: 26 mmol/L (ref 22–32)
Calcium: 9.3 mg/dL (ref 8.9–10.3)
Chloride: 109 mmol/L (ref 98–111)
Creatinine: 0.68 mg/dL (ref 0.44–1.00)
GFR, Estimated: >60 mL/min (ref >60)
Glucose, Bld: 84 mg/dL (ref 70–99)
Potassium: 3.8 mmol/L (ref 3.5–5.1)
Sodium: 141 mmol/L (ref 135–145)
Total Bilirubin: 0.3 mg/dL (ref 0.3–1.2)
Total Protein: 7.5 g/dL (ref 6.5–8.1)

## 2021-07-24 LAB — IRON AND IRON BINDING CAPACITY (CC-WL,HP ONLY)
Iron: 21 ug/dL — ABNORMAL LOW (ref 28–170)
Saturation Ratios: 4 % — ABNORMAL LOW (ref 10.4–31.8)
TIBC: 528 ug/dL — ABNORMAL HIGH (ref 250–450)
UIBC: 507 ug/dL — ABNORMAL HIGH (ref 148–442)

## 2021-07-24 LAB — VITAMIN B12: Vitamin B-12: 2963 pg/mL — ABNORMAL HIGH (ref 180–914)

## 2021-07-24 LAB — FERRITIN: Ferritin: 5 ng/mL — ABNORMAL LOW (ref 11–307)

## 2021-07-24 LAB — TSH: TSH: 2.678 u[IU]/mL (ref 0.308–3.960)

## 2021-07-24 NOTE — Progress Notes (Signed)
[place] [phone]   [phone]  CONSULT NOTE  REFERRING PHYSICIAN: Dr. Allyn Kenner  REASON FOR CONSULTATION:  51 years old white female with iron deficiency anemia  HPI Erin Vaughn is a 51 y.o. female with past medical history significant for anxiety, asthma, depression, GERD as well as migraine headache.  The patient was doing fine until February 2022 when she had a gastric bypass surgery and she lost 115 pounds since that surgery.  She had a routine annual follow-up visit for evaluation and repeat blood work showed persistent anemia with low serum iron and ferritin.  Her lab work was done at [facility] and it was recommended for her to have iron infusion but because of insurance issues she would not be able to do it with [facility] and she was referred to [facility] for her treatment and management.  The patient tried oral iron tablet with [employer] and multivitamins but she was unable to tolerate it and she had a lot of constipation. When seen today the patient is feeling fine except for fatigue all the time.  She has occasional dizzy spells but no significant chest pain, shortness of breath, cough or hemoptysis.  She has occasional nausea.  She has craving for ice. Family history significant for mother with meningioma and father died from heart attack. The patient is single and has 1 son.  She works at a [employer].  She has a history of smoking less than 1 pack/day for around 34 years and unfortunately she continues to smoke and I strongly encouraged her to quit smoking.  She has no current history of alcohol or drug abuse.  HPI  Past Medical History:  Diagnosis Date   Achilles tendon rupture    2014   Anxiety    Asthma    adult onset no problems   Complication of anesthesia 2001   woke up during gallbladder surgery   Depression    GERD (gastroesophageal reflux disease)    History of MRSA infection 2010   right side Abdomen    Hypertension    Migraine    hx   PONV (postoperative nausea and vomiting)    prior surger 14 none during gallbladder 2000    Past Surgical History:  Procedure Laterality Date   ACHILLES TENDON SURGERY Right 05/10/2013   Procedure: RIGHT ACHILLES TENDON REPAIR;  Surgeon: Johnn Hai, MD;  Location: [facility];  Service: Orthopedics;  Laterality: Right;  achilles tendon   CHOLECYSTECTOMY  2001    Family History  Problem Relation Age of Onset   Cancer Mother    COPD Other    Hypertension Other    Asthma Other     Social History Social History   Tobacco Use   Smoking status: Every Day    Packs/day: 0.50    Years: 15.00    Pack years: 7.50    Types: Cigarettes   Smokeless tobacco: Never   Tobacco comments:    PCP is working with patient  Vaping Use   Vaping Use: Former  Substance Use Topics   Alcohol use: No   Drug use: No    Allergies  Allergen Reactions   Penicillins Anaphylaxis and Other (See Comments)    Has patient had a PCN reaction causing immediate rash, facial/tongue/throat swelling, SOB or lightheadedness with hypotension: Yes Has patient had a PCN reaction causing severe rash involving mucus membranes or skin necrosis: No Has patient had a PCN reaction that required  hospitalization: Yes Has patient had a PCN reaction occurring within the last 10 years: No If all of the above answers are "NO", then may proceed with Cephalosporin use.    Zithromax [Azithromycin] Anaphylaxis, Swelling and Other (See Comments)    Swelling, mouth and throat   Nsaids     Other reaction(s): Unknown   Vancomycin Other (See Comments)    Burning and itching.  ? RED MAN SYNDROME ? "Makes body red"   Adhesive [Tape] Rash    Use paper tape    Current Outpatient Medications  Medication Sig Dispense Refill   ALPRAZolam (XANAX) 0.5 MG tablet Take 0.5 mg by mouth 2 (two) times daily as needed.     aprepitant (EMEND) 40 MG capsule Take 40 mg by mouth every morning.      atorvastatin (LIPITOR) 20 MG tablet Take 20 mg by mouth at bedtime.     bismuth subsalicylate (PEPTO BISMOL) 262 MG/15ML suspension Take by mouth.     buPROPion (WELLBUTRIN XL) 150 MG 24 hr tablet Take 150 mg by mouth daily.     buPROPion (WELLBUTRIN XL) 300 MG 24 hr tablet Take 300 mg by mouth daily.     calcium carbonate (OS-CAL) 1250 (500 Ca) MG chewable tablet Chew by mouth.     Cholecalciferol (VITAMIN D3) 5000 units CAPS Take 5,000 Units by mouth daily.      Cholecalciferol 50 MCG (2000 UT) TABS Take by mouth.     Cyanocobalamin (B-12) 2500 MCG TABS Take 5,000 mcg by mouth daily.      cyclobenzaprine (FLEXERIL) 5 MG tablet Take 5-10 mg by mouth at bedtime.     diclofenac (VOLTAREN) 50 MG EC tablet Take by mouth.     diclofenac (VOLTAREN) 75 MG EC tablet Take 75 mg by mouth 2 (two) times daily as needed for pain.  1   doxycycline (VIBRA-TABS) 100 MG tablet Take 100 mg by mouth daily as needed.     Evening Primrose Oil 1000 MG CAPS Take 1,000 mg by mouth at bedtime.     fluticasone (FLONASE) 50 MCG/ACT nasal spray Place 1 spray into both nostrils daily as needed for allergies.  4   fluticasone (FLONASE) 50 MCG/ACT nasal spray fluticasone propionate 50 mcg/actuation nasal spray,suspension  Spray 1 spray every day by intranasal route.     gabapentin (NEURONTIN) 300 MG capsule SMARTSIG:1 Capsule(s) By Mouth     gabapentin (NEURONTIN) 600 MG tablet Take 600 mg by mouth 2 (two) times daily.   1   gabapentin (NEURONTIN) 600 MG tablet Take 1 tablet (600 mg total) by mouth 2 (two) times daily. 60 tablet 0   losartan-hydrochlorothiazide (HYZAAR) 100-12.5 MG tablet Take 1 tablet by mouth daily.     methocarbamol (ROBAXIN-750) 750 MG tablet Take 1 tablet (750 mg total) by mouth 4 (four) times daily. 30 tablet 0   metoCLOPramide (REGLAN) 10 MG tablet Take 10 mg by mouth every morning.     montelukast (SINGULAIR) 10 MG tablet Take 10 mg by mouth at bedtime.     montelukast (SINGULAIR) 10 MG tablet Take  by mouth.     mupirocin ointment (BACTROBAN) 2 % Place 1 application into the nose 2 (two) times daily. Started 02/15/17 for 5 days twice daily     NEOMYCIN-POLYMYXIN-HYDROCORTISONE (CORTISPORIN) 1 % SOLN OTIC solution Apply to nail beds from procedure site twice daily after soaks 10 mL 0   pantoprazole (PROTONIX) 40 MG tablet Take 40 mg by mouth daily before breakfast.   3  pantoprazole (PROTONIX) 40 MG tablet pantoprazole 40 mg tablet,delayed release  Take 1 tablet twice a day by oral route.     phentermine 37.5 MG capsule Take 37.5 mg by mouth every morning.     telmisartan-hydrochlorothiazide (MICARDIS HCT) 80-12.5 MG tablet Take 1 tablet by mouth at bedtime.   4   tiZANidine (ZANAFLEX) 4 MG tablet Take 4 mg by mouth every 8 (eight) hours as needed for muscle spasms.  0   Turmeric Curcumin 500 MG CAPS Take 500 mg by mouth 2 (two) times daily.     Vitamin D, Ergocalciferol, (DRISDOL) 1.25 MG (50000 UNIT) CAPS capsule Take 50,000 Units by mouth 2 (two) times a week.     zolpidem (AMBIEN) 5 MG tablet Take by mouth.     No current facility-administered medications for this visit.    Review of Systems  Constitutional: positive for fatigue Eyes: negative Ears, nose, mouth, throat, and face: negative Respiratory: negative Cardiovascular: negative Gastrointestinal: positive for nausea Genitourinary:negative Integument/breast: negative Hematologic/lymphatic: negative Musculoskeletal:negative Neurological: positive for dizziness Behavioral/Psych: negative Endocrine: negative Allergic/Immunologic: negative  Physical Exam  ULA:GTXMI, healthy, no distress, well nourished, and well developed SKIN: skin color, texture, turgor are normal, no rashes or significant lesions HEAD: Normocephalic, No masses, lesions, tenderness or abnormalities EYES: normal, PERRLA, Conjunctiva are pink and non-injected EARS: External ears normal, Canals clear OROPHARYNX:no exudate, no erythema, and lips,  buccal mucosa, and tongue normal  NECK: supple, no adenopathy, no JVD LYMPH:  no palpable lymphadenopathy, no hepatosplenomegaly BREAST:not examined LUNGS: clear to auscultation , and palpation HEART: regular rate & rhythm, no murmurs, and no gallops ABDOMEN:abdomen soft, non-tender, normal bowel sounds, and no masses or organomegaly BACK: Back symmetric, no curvature., No CVA tenderness EXTREMITIES:no joint deformities, effusion, or inflammation, no edema  NEURO: alert & oriented x 3 with fluent speech, no focal motor/sensory deficits  PERFORMANCE STATUS: ECOG 0  LABORATORY DATA: Lab Results  Component Value Date   WBC 7.8 07/24/2021   HGB 10.2 (L) 07/24/2021   HCT 33.3 (L) 07/24/2021   MCV 78.2 (L) 07/24/2021   PLT 317 07/24/2021      Chemistry      Component Value Date/Time   NA 138 02/18/2017 1124   K 4.0 02/18/2017 1124   CL 105 02/18/2017 1124   CO2 25 02/18/2017 1124   BUN 6 02/18/2017 1124   CREATININE 0.61 02/18/2017 1124      Component Value Date/Time   CALCIUM 9.0 02/18/2017 1124   ALKPHOS 105 01/28/2015 1741   AST 24 01/28/2015 1741   ALT 38 01/28/2015 1741   BILITOT 0.4 01/28/2015 1741       RADIOGRAPHIC STUDIES: No results found.  ASSESSMENT: This is a very pleasant 51 years old white female with microcytic anemia secondary to severe iron deficiency after a gastric bypass surgery and lack of iron absorption.  The patient has no improvement in her anemia with the oral iron supplement and she also has a lot of gastrointestinal issues with her iron tablets mainly constipation.   PLAN: I had a lengthy discussion with the patient today about her current condition and treatment options. I repeated several studies today including CBC, iron study and ferritin as well as vitamin B12, serum folate, serum protein electrophoresis with immunofixation to rule out any other underlying etiology. CBC today showed hemoglobin of 10.1 hematocrit 33.3% with MCV of 78.2.   Ferritin level was low at 5.  Serum iron 21, iron saturation 4%.  TSH was normal. I recommended  for the patient to proceed with iron infusion with Venofer 300 mg IV weekly for 3 weeks. This will be done at the [place] [facility] infusion center. I will see her back for follow-up visit in 3 months for evaluation with repeat CBC, iron study and ferritin. The patient was advised to call immediately if she has any concerning symptoms in the interval. The patient voices understanding of current disease status and treatment options and is in agreement with the current care plan.  All questions were answered. The patient knows to call the clinic with any problems, questions or concerns. We can certainly see the patient much sooner if necessary.  Thank you so much for allowing me to participate in the care of Erin Vaughn. I will continue to follow up the patient with you and assist in her care.  The total time spent in the appointment was 60 minutes.  Disclaimer: This note was dictated with voice recognition software. Similar sounding words can inadvertently be transcribed and may not be corrected upon review.   Erin Vaughn July 24, 2021, 11:55 AM

## 2021-07-25 ENCOUNTER — Encounter: Payer: Self-pay | Admitting: Internal Medicine

## 2021-07-26 LAB — PROTEIN ELECTROPHORESIS, SERUM, WITH REFLEX
A/G Ratio: 1.1 (ref 0.7–1.7)
Albumin ELP: 3.7 g/dL (ref 2.9–4.4)
Alpha-1-Globulin: 0.3 g/dL (ref 0.0–0.4)
Alpha-2-Globulin: 1 g/dL (ref 0.4–1.0)
Beta Globulin: 1.1 g/dL (ref 0.7–1.3)
Gamma Globulin: 1.1 g/dL (ref 0.4–1.8)
Globulin, Total: 3.5 g/dL (ref 2.2–3.9)
Total Protein ELP: 7.2 g/dL (ref 6.0–8.5)

## 2021-07-31 ENCOUNTER — Other Ambulatory Visit: Payer: Self-pay

## 2021-07-31 ENCOUNTER — Ambulatory Visit (INDEPENDENT_AMBULATORY_CARE_PROVIDER_SITE_OTHER): Payer: 59

## 2021-07-31 VITALS — BP 163/84 | HR 65 | Temp 98.1°F | Resp 18 | Ht 62.0 in | Wt 192.0 lb

## 2021-07-31 DIAGNOSIS — D509 Iron deficiency anemia, unspecified: Secondary | ICD-10-CM

## 2021-07-31 MED ORDER — SODIUM CHLORIDE 0.9 % IV SOLN
300.0000 mg | INTRAVENOUS | Status: DC
  Administered 2021-07-31: 300 mg via INTRAVENOUS
  Filled 2021-07-31: qty 15

## 2021-07-31 NOTE — Progress Notes (Signed)
Diagnosis: Iron Deficiency Anemia ? ?Provider:  Marshell Garfinkel, MD ? ?Procedure: Infusion ? ?IV Type: Peripheral, IV Location: R Antecubital ? ?Venofer (Iron Sucrose), Dose: 300 mg ? ?Infusion Start Time: [phone] ? ?Infusion Stop Time: 4097 ? ?Post Infusion IV Care: Observation period completed ? ?Discharge: Condition: Good, Destination: Home . AVS provided to patient.  ? ?Performed by:  Cleophus Molt, RN  ?  ?

## 2021-08-07 ENCOUNTER — Ambulatory Visit (INDEPENDENT_AMBULATORY_CARE_PROVIDER_SITE_OTHER): Payer: 59

## 2021-08-07 ENCOUNTER — Other Ambulatory Visit: Payer: Self-pay

## 2021-08-07 VITALS — BP 159/78 | HR 66 | Temp 97.8°F | Resp 16 | Ht 63.0 in | Wt 185.0 lb

## 2021-08-07 DIAGNOSIS — D509 Iron deficiency anemia, unspecified: Secondary | ICD-10-CM | POA: Diagnosis not present

## 2021-08-07 MED ORDER — SODIUM CHLORIDE 0.9 % IV SOLN
300.0000 mg | INTRAVENOUS | Status: DC
  Administered 2021-08-07: 300 mg via INTRAVENOUS
  Filled 2021-08-07: qty 15

## 2021-08-07 NOTE — Progress Notes (Signed)
Diagnosis: Iron Deficiency Anemia ? ?Provider:  Marshell Garfinkel, MD ? ?Procedure: Infusion ? ?IV Type: Peripheral, IV Location: R Antecubital ? ?Venofer (Iron Sucrose), Dose: 300 mg ? ?Infusion Start Time: 0930 ? ?Infusion Stop Time: 1127 ? ?Post Infusion IV Care: Peripheral IV Discontinued ? ?Discharge: Condition: Good, Destination: Home . AVS provided to patient.  ? ?Performed by:  Koren Shiver, RN  ?  ?

## 2021-08-14 ENCOUNTER — Other Ambulatory Visit: Payer: Self-pay

## 2021-08-14 ENCOUNTER — Ambulatory Visit (INDEPENDENT_AMBULATORY_CARE_PROVIDER_SITE_OTHER): Payer: 59

## 2021-08-14 VITALS — BP 154/84 | HR 62 | Temp 97.5°F | Resp 18 | Ht 63.0 in | Wt 184.6 lb

## 2021-08-14 DIAGNOSIS — D509 Iron deficiency anemia, unspecified: Secondary | ICD-10-CM | POA: Diagnosis not present

## 2021-08-14 MED ORDER — SODIUM CHLORIDE 0.9 % IV SOLN
300.0000 mg | INTRAVENOUS | Status: DC
  Administered 2021-08-14: 300 mg via INTRAVENOUS
  Filled 2021-08-14: qty 15

## 2021-08-14 NOTE — Progress Notes (Signed)
Diagnosis: Iron Deficiency Anemia ? ?Provider:  Marshell Garfinkel, MD ? ?Procedure: Infusion ? ?IV Type: Peripheral, IV Location: R Antecubital ? ?Venofer (Iron Sucrose), Dose: 300 mg ? ?Infusion Start Time: [phone] ? ?Infusion Stop Time: 0998 ? ?Post Infusion IV Care: Peripheral IV Discontinued ? ?Discharge: Condition: Good, Destination: Home . AVS provided to patient.  ? ?Performed by:  Cleophus Molt, RN  ?  ?

## 2021-10-24 ENCOUNTER — Inpatient Hospital Stay: Payer: 59

## 2021-10-24 ENCOUNTER — Other Ambulatory Visit: Payer: Self-pay

## 2021-10-24 ENCOUNTER — Inpatient Hospital Stay: Payer: 59 | Attending: Internal Medicine | Admitting: Internal Medicine

## 2021-10-24 VITALS — BP 158/76 | HR 61 | Temp 97.8°F | Resp 18 | Ht 63.0 in | Wt 180.0 lb

## 2021-10-24 DIAGNOSIS — I10 Essential (primary) hypertension: Secondary | ICD-10-CM | POA: Diagnosis not present

## 2021-10-24 DIAGNOSIS — D508 Other iron deficiency anemias: Secondary | ICD-10-CM | POA: Insufficient documentation

## 2021-10-24 DIAGNOSIS — D509 Iron deficiency anemia, unspecified: Secondary | ICD-10-CM

## 2021-10-24 DIAGNOSIS — Z9884 Bariatric surgery status: Secondary | ICD-10-CM | POA: Insufficient documentation

## 2021-10-24 LAB — CBC WITH DIFFERENTIAL (CANCER CENTER ONLY)
Abs Immature Granulocytes: 0.01 10*3/uL (ref 0.00–0.07)
Basophils Absolute: 0.1 10*3/uL (ref 0.0–0.1)
Basophils Relative: 1 %
Eosinophils Absolute: 0.3 10*3/uL (ref 0.0–0.5)
Eosinophils Relative: 4 %
HCT: 38.8 % (ref 36.0–46.0)
Hemoglobin: 12.8 g/dL (ref 12.0–15.0)
Immature Granulocytes: 0 %
Lymphocytes Relative: 49 %
Lymphs Abs: 3.7 10*3/uL (ref 0.7–4.0)
MCH: 29 pg (ref 26.0–34.0)
MCHC: 33 g/dL (ref 30.0–36.0)
MCV: 87.8 fL (ref 80.0–100.0)
Monocytes Absolute: 0.6 10*3/uL (ref 0.1–1.0)
Monocytes Relative: 9 %
Neutro Abs: 2.8 10*3/uL (ref 1.7–7.7)
Neutrophils Relative %: 37 %
Platelet Count: 220 10*3/uL (ref 150–400)
RBC: 4.42 MIL/uL (ref 3.87–5.11)
RDW: 19.1 % — ABNORMAL HIGH (ref 11.5–15.5)
WBC Count: 7.5 10*3/uL (ref 4.0–10.5)
nRBC: 0 % (ref 0.0–0.2)

## 2021-10-24 LAB — IRON AND IRON BINDING CAPACITY (CC-WL,HP ONLY)
Iron: 50 ug/dL (ref 28–170)
Saturation Ratios: 14 % (ref 10.4–31.8)
TIBC: 370 ug/dL (ref 250–450)
UIBC: 320 ug/dL (ref 148–442)

## 2021-10-24 LAB — FERRITIN: Ferritin: 24 ng/mL (ref 11–307)

## 2021-10-24 NOTE — Progress Notes (Signed)
[facility] [phone]   [phone]  OFFICE PROGRESS NOTE  Celene Squibb, MD [address] [place] [address]  DIAGNOSIS: microcytic anemia secondary to severe iron deficiency after a gastric bypass surgery and lack of iron absorption.  PRIOR THERAPY: Venofer 300 mg IV weekly for 3 weeks.  CURRENT THERAPY: None  INTERVAL HISTORY: Erin Vaughn 51 y.o. female returns to the clinic today for follow-up visit.  The patient is feeling fine today except for mild fatigue.  She felt a little bit better after the Venofer infusion.  She denied having any current dizzy spells or shortness of breath.  She has no nausea, vomiting, diarrhea or constipation.  She has no bleeding, bruises or ecchymosis.  She has no chest pain, shortness of breath, cough or hemoptysis.  She has no headache or visual changes.  The patient tolerated her iron infusion fairly well except for the first dose when she has some mild nausea.  She is here today for evaluation and repeat blood work.  MEDICAL HISTORY: Past Medical History:  Diagnosis Date   Achilles tendon rupture    2014   Anxiety    Asthma    adult onset no problems   Complication of anesthesia 2001   woke up during gallbladder surgery   Depression    GERD (gastroesophageal reflux disease)    History of MRSA infection 2010   right side Abdomen   Hypertension    Migraine    hx   PONV (postoperative nausea and vomiting)    prior surger 14 none during gallbladder 2000    ALLERGIES:  is allergic to penicillins, zithromax [azithromycin], nsaids, vancomycin, and adhesive [tape].  MEDICATIONS:  Current Outpatient Medications  Medication Sig Dispense Refill   buPROPion (WELLBUTRIN XL) 300 MG 24 hr tablet Take 300 mg by mouth daily.     Calcium Carbonate (CALCIUM 500 PO) Take 1 tablet by mouth. 1 tablet per week     ALPRAZolam (XANAX) 0.5 MG tablet Take 0.5 mg by mouth 2 (two) times daily as needed.      BIOTIN 5000 PO Take 1 tablet by mouth daily.     calcium carbonate (OS-CAL) 1250 (500 Ca) MG chewable tablet Chew by mouth.     Cholecalciferol (VITAMIN D3) 25 MCG (1000 UT) CHEW Chew 2,000 Units by mouth in the morning and at bedtime.     COLLAGEN PO Take 50 mg by mouth 2 (two) times daily.     Cyanocobalamin (VITAMIN B 12 PO) Take 1,000 mg by mouth 2 (two) times daily.     cyclobenzaprine (FLEXERIL) 5 MG tablet Take 5-10 mg by mouth at bedtime.     diclofenac (VOLTAREN) 75 MG EC tablet Take 75 mg by mouth 2 (two) times daily as needed for pain.  1   doxycycline (VIBRA-TABS) 100 MG tablet Take 100 mg by mouth daily as needed.     fluticasone (FLONASE) 50 MCG/ACT nasal spray Place 1 spray into both nostrils daily as needed for allergies.  4   gabapentin (NEURONTIN) 600 MG tablet Take 600 mg by mouth 2 (two) times daily.   1   montelukast (SINGULAIR) 10 MG tablet Take by mouth.     Multiple Vitamin (MULTIVITAMIN) tablet Take 2 tablets by mouth daily. "Womens "     NEOMYCIN-POLYMYXIN-HYDROCORTISONE (CORTISPORIN) 1 % SOLN OTIC solution Apply to nail beds from procedure site twice daily after soaks (Patient not taking: Reported on 07/24/2021) 10  mL 0   Omega-3 Fatty Acids (OMEGA-3 FISH OIL) 1200 MG CAPS Take 1 capsule by mouth daily.     pantoprazole (PROTONIX) 40 MG tablet pantoprazole 40 mg tablet,delayed release  Take 1 tablet twice a day by oral route.     Theanine 200 MG CAPS Take 200 mg by mouth 2 (two) times daily.     Turmeric (QC TUMERIC COMPLEX) 500 MG CAPS Take 1,350 mg by mouth daily.     vitamin C (ASCORBIC ACID) 250 MG tablet Take 250 mg by mouth 2 (two) times daily.     No current facility-administered medications for this visit.    SURGICAL HISTORY:  Past Surgical History:  Procedure Laterality Date   ACHILLES TENDON SURGERY Right 05/10/2013   Procedure: RIGHT ACHILLES TENDON REPAIR;  Surgeon: Johnn Hai, MD;  Location: [facility];  Service: Orthopedics;  Laterality: Right;   achilles tendon   CHOLECYSTECTOMY  2001    REVIEW OF SYSTEMS:  A comprehensive review of systems was negative except for: Constitutional: positive for fatigue   PHYSICAL EXAMINATION: General appearance: alert, cooperative, fatigued, and no distress Head: Normocephalic, without obvious abnormality, atraumatic Neck: no adenopathy, no JVD, supple, symmetrical, trachea midline, and thyroid not enlarged, symmetric, no tenderness/mass/nodules Lymph nodes: Cervical, supraclavicular, and axillary nodes normal. Resp: clear to auscultation bilaterally Back: symmetric, no curvature. ROM normal. No CVA tenderness. Cardio: regular rate and rhythm, S1, S2 normal, no murmur, click, rub or gallop GI: soft, non-tender; bowel sounds normal; no masses,  no organomegaly Extremities: extremities normal, atraumatic, no cyanosis or edema  ECOG PERFORMANCE STATUS: 1 - Symptomatic but completely ambulatory  Blood pressure (!) 158/76, pulse 61, temperature 97.8 F (36.6 C), temperature source Temporal, resp. rate 18, height '5\' 3"'$  (1.6 m), weight 180 lb (81.6 kg), SpO2 100 %.  LABORATORY DATA: Lab Results  Component Value Date   WBC 7.5 10/24/2021   HGB 12.8 10/24/2021   HCT 38.8 10/24/2021   MCV 87.8 10/24/2021   PLT 220 10/24/2021      Chemistry      Component Value Date/Time   NA 141 07/24/2021 1127   K 3.8 07/24/2021 1127   CL 109 07/24/2021 1127   CO2 26 07/24/2021 1127   BUN 16 07/24/2021 1127   CREATININE 0.68 07/24/2021 1127      Component Value Date/Time   CALCIUM 9.3 07/24/2021 1127   ALKPHOS 121 07/24/2021 1127   AST 27 07/24/2021 1127   ALT 30 07/24/2021 1127   BILITOT 0.3 07/24/2021 1127       RADIOGRAPHIC STUDIES: No results found.  ASSESSMENT AND PLAN: This is a very pleasant 51 years old white female with microcytic anemia secondary to iron deficiency from malabsorption secondary to gastrointestinal bypass surgery many years ago. The patient was treated recently with iron  infusion with Venofer 300 mg IV weekly for 3 weeks and tolerated this treatment well. Her CBC today showed improvement of her hemoglobin and hematocrit within normal range.  Iron study and ferritin are still pending. I recommended for the patient to continue on observation with repeat CBC, iron study and ferritin in 6 months. If the pending iron studies showed significant deficiency, I will arrange for the patient to receive additional iron infusion. For the hypertension she was advised to take her blood pressure medications as prescribed and to monitor it closely at home. The patient was advised to call immediately if she has any other concerning symptoms in the interval. The patient voices understanding of current  disease status and treatment options and is in agreement with the current care plan.  All questions were answered. The patient knows to call the clinic with any problems, questions or concerns. We can certainly see the patient much sooner if necessary. The total time spent in the appointment was 20 minutes.  Disclaimer: This note was dictated with voice recognition software. Similar sounding words can inadvertently be transcribed and may not be corrected upon review.

## 2021-11-12 ENCOUNTER — Other Ambulatory Visit (HOSPITAL_COMMUNITY): Payer: Self-pay | Admitting: Family Medicine

## 2021-11-12 DIAGNOSIS — Z1231 Encounter for screening mammogram for malignant neoplasm of breast: Secondary | ICD-10-CM

## 2021-11-21 ENCOUNTER — Encounter: Payer: Self-pay | Admitting: Internal Medicine

## 2021-11-26 ENCOUNTER — Ambulatory Visit (HOSPITAL_COMMUNITY)
Admission: RE | Admit: 2021-11-26 | Discharge: 2021-11-26 | Disposition: A | Discharge status: Home / Self Care | Payer: 59 | Source: Ambulatory Visit | Attending: Family Medicine | Admitting: Family Medicine

## 2021-11-26 DIAGNOSIS — Z1231 Encounter for screening mammogram for malignant neoplasm of breast: Secondary | ICD-10-CM | POA: Diagnosis present

## 2021-12-11 ENCOUNTER — Encounter: Payer: Self-pay | Admitting: *Deleted

## 2022-01-09 DIAGNOSIS — L02412 Cutaneous abscess of left axilla: Secondary | ICD-10-CM | POA: Diagnosis not present

## 2022-02-01 DIAGNOSIS — Z23 Encounter for immunization: Secondary | ICD-10-CM | POA: Diagnosis not present

## 2022-03-18 ENCOUNTER — Encounter: Payer: Self-pay | Admitting: Internal Medicine

## 2022-03-19 ENCOUNTER — Encounter: Payer: Self-pay | Admitting: Internal Medicine

## 2022-04-25 ENCOUNTER — Inpatient Hospital Stay: Payer: Self-pay | Attending: Internal Medicine

## 2022-04-25 ENCOUNTER — Inpatient Hospital Stay: Payer: Self-pay | Admitting: Internal Medicine

## 2022-05-28 ENCOUNTER — Encounter: Payer: Self-pay | Admitting: *Deleted

## 2022-09-05 DIAGNOSIS — I10 Essential (primary) hypertension: Secondary | ICD-10-CM | POA: Diagnosis not present

## 2022-09-05 DIAGNOSIS — Z7182 Exercise counseling: Secondary | ICD-10-CM | POA: Diagnosis not present

## 2022-09-05 DIAGNOSIS — F17200 Nicotine dependence, unspecified, uncomplicated: Secondary | ICD-10-CM | POA: Diagnosis not present

## 2022-09-05 DIAGNOSIS — F411 Generalized anxiety disorder: Secondary | ICD-10-CM | POA: Diagnosis not present

## 2022-09-05 DIAGNOSIS — E669 Obesity, unspecified: Secondary | ICD-10-CM | POA: Diagnosis not present

## 2022-09-05 DIAGNOSIS — Z6829 Body mass index (BMI) 29.0-29.9, adult: Secondary | ICD-10-CM | POA: Diagnosis not present

## 2022-09-05 DIAGNOSIS — Z79899 Other long term (current) drug therapy: Secondary | ICD-10-CM | POA: Diagnosis not present

## 2022-09-05 DIAGNOSIS — Z713 Dietary counseling and surveillance: Secondary | ICD-10-CM | POA: Diagnosis not present

## 2022-09-05 DIAGNOSIS — H6993 Unspecified Eustachian tube disorder, bilateral: Secondary | ICD-10-CM | POA: Diagnosis not present

## 2022-09-05 DIAGNOSIS — J302 Other seasonal allergic rhinitis: Secondary | ICD-10-CM | POA: Diagnosis not present

## 2022-09-20 DIAGNOSIS — K219 Gastro-esophageal reflux disease without esophagitis: Secondary | ICD-10-CM | POA: Diagnosis not present

## 2022-09-20 DIAGNOSIS — Z6828 Body mass index (BMI) 28.0-28.9, adult: Secondary | ICD-10-CM | POA: Diagnosis not present

## 2022-09-20 DIAGNOSIS — G43009 Migraine without aura, not intractable, without status migrainosus: Secondary | ICD-10-CM | POA: Diagnosis not present

## 2022-09-20 DIAGNOSIS — H6993 Unspecified Eustachian tube disorder, bilateral: Secondary | ICD-10-CM | POA: Diagnosis not present

## 2022-09-20 DIAGNOSIS — E669 Obesity, unspecified: Secondary | ICD-10-CM | POA: Diagnosis not present

## 2022-09-20 DIAGNOSIS — I10 Essential (primary) hypertension: Secondary | ICD-10-CM | POA: Diagnosis not present

## 2022-09-20 DIAGNOSIS — J029 Acute pharyngitis, unspecified: Secondary | ICD-10-CM | POA: Diagnosis not present

## 2022-09-20 DIAGNOSIS — G8929 Other chronic pain: Secondary | ICD-10-CM | POA: Diagnosis not present

## 2022-09-20 DIAGNOSIS — M545 Low back pain, unspecified: Secondary | ICD-10-CM | POA: Diagnosis not present

## 2022-09-20 DIAGNOSIS — F17200 Nicotine dependence, unspecified, uncomplicated: Secondary | ICD-10-CM | POA: Diagnosis not present

## 2022-09-20 DIAGNOSIS — F411 Generalized anxiety disorder: Secondary | ICD-10-CM | POA: Diagnosis not present

## 2022-10-01 DIAGNOSIS — H6693 Otitis media, unspecified, bilateral: Secondary | ICD-10-CM | POA: Diagnosis not present

## 2022-10-09 ENCOUNTER — Encounter: Payer: Self-pay | Admitting: Internal Medicine

## 2022-12-08 ENCOUNTER — Ambulatory Visit
Admission: RE | Admit: 2022-12-08 | Discharge: 2022-12-08 | Disposition: A | Discharge status: Home / Self Care | Payer: 59 | Source: Ambulatory Visit | Attending: Nurse Practitioner | Admitting: Nurse Practitioner

## 2022-12-08 VITALS — BP 158/78 | HR 75 | Temp 97.6°F | Resp 18

## 2022-12-08 DIAGNOSIS — L309 Dermatitis, unspecified: Secondary | ICD-10-CM

## 2022-12-08 MED ORDER — DOXYCYCLINE HYCLATE 100 MG PO TABS
100.0000 mg | ORAL_TABLET | Freq: Two times a day (BID) | ORAL | 0 refills | Status: AC
Start: 2022-12-08 — End: 2022-12-13

## 2022-12-08 NOTE — Discharge Instructions (Signed)
Take medication as prescribed. May take over-the-counter Tylenol or ibuprofen as needed for pain, fever, general discomfort. Keep the lower extremities elevated above the level of the heart as much as possible. When you are working, recommend the use of compression socks to help with swelling and pain of your lower extremities. As discussed, it is recommended that you follow-up with your primary care physician as soon as possible for reevaluation. If you experience worsening pain in the left lower leg, with redness, swelling, or other concerns, please go to the emergency department immediately for further evaluation. Follow-up as needed.

## 2022-12-08 NOTE — ED Provider Notes (Signed)
[facility]    CSN: [id number] Arrival date & time: 12/08/22  1332      History   Chief Complaint Chief Complaint  Patient presents with   Leg Swelling    Left leg swells from knee down but I have developed a red splotchy rash like issue. It's warm to the touch but doesn't itch or hurt. The swelling hurts around the ankle foot area.  And today I noticed the rash like redness is on the right leg too - Entered by patient    HPI Erin Vaughn is a 52 y.o. female.   The history is provided by the patient.   The patient presents for complaints of bilateral lower extremity redness and swelling in the left lower extremity.  Patient states symptoms have been present for the past 4 days.  Patient states prior to her symptoms starting, she went to the pool on a hot day.  She states several days later, she noticed a spot of redness on 1 leg, then over the past 1 to 2 days, the area has increased in size and is on both legs.  The  redness and swelling is greater in the lower extremity.  Patient denies itchiness to the rash.  She does complain of pain in the left ankle.  The patient denies fever, chills, injury, trauma, chest pain, abdominal pain, nausea, vomiting, or diarrhea.  Patient denies pain in her left calf.  Patient states that she normally has swelling in the left lower leg at baseline, but states that it has worsened.  Patient states that she stands on her feet for long periods of time at work, states that she has done this for several years.  She also reports that she had weight loss surgery, and approximately 1 year ago, her PCP wanted her to have an ultrasound of her left lower leg, but states she cannot have it done due to her insurance.  Past Medical History:  Diagnosis Date   Achilles tendon rupture    2014   Anxiety    Asthma    adult onset no problems   Complication of anesthesia 2001   woke up during gallbladder surgery   Depression    GERD (gastroesophageal  reflux disease)    History of MRSA infection 2010   right side Abdomen   Hypertension    Migraine    hx   PONV (postoperative nausea and vomiting)    prior surger 14 none during gallbladder 2000    Patient Active Problem List   Diagnosis Date Noted   Hidradenitis suppurativa 11/06/2020   Ingrowing toenail 11/06/2020   Iron deficiency anemia 11/06/2020   Abnormal liver function tests 11/05/2020   Chronic low back pain 11/05/2020   Generalized anxiety disorder 11/05/2020   Seasonal allergic rhinitis 11/05/2020   Essential hypertension 11/01/2020   Gastroesophageal reflux disease without esophagitis 11/01/2020   Postoperative nausea and vomiting 07/19/2020   Former smoker 07/04/2020   Hypertriglyceridemia 01/27/2020   Nicotine dependence 01/27/2020   Prediabetes 01/27/2020   Vitamin D deficiency 01/27/2020   OSA (obstructive sleep apnea) 01/20/2020   Morbid obesity due to excess calories (HCC) 11/30/2019   S/P lumbar spinal fusion 02/20/2017   Rupture of right Achilles tendon 05/10/2013   Achilles tendon tear 05/10/2013    Past Surgical History:  Procedure Laterality Date   ACHILLES TENDON SURGERY Right 05/10/2013   Procedure: RIGHT ACHILLES TENDON REPAIR;  Surgeon: Javier Docker, MD;  Location: [facility];  Service: Orthopedics;  Laterality: Right;  achilles tendon   CHOLECYSTECTOMY  2001    OB History     Gravida  4   Para  1   Term  1   Preterm      AB  3   Living  1      SAB  3   IAB      Ectopic      Multiple      Live Births               Home Medications    Prior to Admission medications   Medication Sig Start Date End Date Taking? Authorizing Provider  doxycycline (VIBRA-TABS) 100 MG tablet Take 1 tablet (100 mg total) by mouth 2 (two) times daily for 5 days. 12/08/22 12/13/22 Yes Renette Hsu-Warren, Sadie Haber, NP  ALPRAZolam Prudy Feeler) 0.5 MG tablet Take 0.5 mg by mouth 2 (two) times daily as needed. 10/19/20   [provider]  BIOTIN  5000 PO Take 1 tablet by mouth daily.    [provider]  buPROPion (WELLBUTRIN XL) 300 MG 24 hr tablet Take 300 mg by mouth daily. 08/07/21   [provider]  Calcium Carbonate (CALCIUM 500 PO) Take 1 tablet by mouth. 1 tablet per week    [provider]  calcium carbonate (OS-CAL) 1250 (500 Ca) MG chewable tablet Chew by mouth.    [provider]  Cholecalciferol (VITAMIN D3) 25 MCG (1000 UT) CHEW Chew 2,000 Units by mouth in the morning and at bedtime.    [provider]  COLLAGEN PO Take 50 mg by mouth 2 (two) times daily.    [provider]  Cyanocobalamin (VITAMIN B 12 PO) Take 1,000 mg by mouth 2 (two) times daily.    [provider]  fluticasone (FLONASE) 50 MCG/ACT nasal spray Place 1 spray into both nostrils daily as needed for allergies. 11/16/16   [provider]  gabapentin (NEURONTIN) 600 MG tablet Take 600 mg by mouth 2 (two) times daily.  11/25/16   [provider]  montelukast (SINGULAIR) 10 MG tablet Take by mouth. 11/17/19   [provider]  Multiple Vitamin (MULTIVITAMIN) tablet Take 2 tablets by mouth daily. "[employer] copy, Historical, MD  Omega-3 Fatty Acids (OMEGA-3 FISH OIL) 1200 MG CAPS Take 1 capsule by mouth daily.    [provider]  pantoprazole (PROTONIX) 40 MG tablet pantoprazole 40 mg tablet,delayed release  Take 1 tablet twice a day by oral route. 10/07/19   [provider]  Theanine 200 MG CAPS Take 200 mg by mouth 2 (two) times daily.    [provider]  vitamin C (ASCORBIC ACID) 250 MG tablet Take 250 mg by mouth 2 (two) times daily.    [provider]    Family History Family History  Problem Relation Age of Onset   Cancer Mother    COPD Other    Hypertension Other    Asthma Other     Social History Social History   Tobacco Use   Smoking status: Every Day    Current packs/day: 0.50    Average packs/day: 0.5 packs/day for  15.0 years (7.5 ttl pk-yrs)    Types: Cigarettes   Smokeless tobacco: Never   Tobacco comments:    PCP is working with patient  Vaping Use   Vaping status: Former  Substance Use Topics   Alcohol use: No   Drug use: No     Allergies   Penicillins,  Zithromax [azithromycin], Nsaids, Vancomycin, and Adhesive [tape]   Review of Systems Review of Systems Per HPI  Physical Exam Triage Vital Signs ED Triage Vitals  Encounter Vitals Group     BP 12/08/22 1348 (!) 158/78     Systolic BP Percentile --      Diastolic BP Percentile --      Pulse Rate 12/08/22 1348 75     Resp 12/08/22 1348 18     Temp 12/08/22 1348 97.6 F (36.4 C)     Temp Source 12/08/22 1348 Oral     SpO2 12/08/22 1348 97 %     Weight --      Height --      Head Circumference --      Peak Flow --      Pain Score 12/08/22 1350 4     Pain Loc --      Pain Education --      Exclude from Growth Chart --    No data found.  Updated Vital Signs BP (!) 158/78 (BP Location: Right Arm)   Pulse 75   Temp 97.6 F (36.4 C) (Oral)   Resp 18   SpO2 97%   Visual Acuity Right Eye Distance:   Left Eye Distance:   Bilateral Distance:    Right Eye Near:   Left Eye Near:    Bilateral Near:     Physical Exam Vitals and nursing note reviewed.  Constitutional:      General: She is not in acute distress.    Appearance: Normal appearance.  HENT:     Head: Normocephalic.  Eyes:     Extraocular Movements: Extraocular movements intact.     Pupils: Pupils are equal, round, and reactive to light.  Cardiovascular:     Rate and Rhythm: Normal rate and regular rhythm.     Pulses: Normal pulses.     Heart sounds: Normal heart sounds.  Pulmonary:     Effort: Pulmonary effort is normal. No respiratory distress.     Breath sounds: Normal breath sounds. No stridor. No wheezing, rhonchi or rales.  Abdominal:     General: Bowel sounds are normal.     Palpations: Abdomen is soft.     Tenderness: There is no abdominal  tenderness.  Musculoskeletal:     Cervical back: Normal range of motion.  Skin:    General: Skin is warm and dry.     Findings: Rash present.     Comments: Rash appearance to the left lower extremity that extends from the left lower leg up towards the left knee.  The area is flat, mildly warm to palpation.  There is no oozing, fluctuance, or drainage present.  Neurological:     General: No focal deficit present.     Mental Status: She is alert and oriented to person, place, and time.  Psychiatric:        Mood and Affect: Mood normal.        Behavior: Behavior normal.      UC Treatments / Results  Labs (all labs ordered are listed, but only abnormal results are displayed) Labs Reviewed - No data to display  EKG   Radiology No results found.  Procedures Procedures (including critical care time)  Medications Ordered in UC Medications - No data to display  Initial Impression / Assessment and Plan / UC Course  I have reviewed the triage vital signs and the nursing notes.  Pertinent labs & imaging results that were available during my  care of the patient were reviewed by me and considered in my medical decision making (see chart for details).  The patient is well-appearing, she is in no acute distress, vital signs are stable.  Suspect vascular dermatitis; but cannot confirm without further imaging such as an ultrasound.  Differential diagnoses also include cellulitis.  Will cover patient empirically for cellulitis.  Supportive care recommendations were provided and discussed with the patient to include elevating the lower extremity above the level of the heart, use of TED hose, and over-the-counter analgesics for pain or discomfort.  Patient was advised to follow-up with her primary care physician for further evaluation.  Patient was advised to go to the emergency department immediately if she experiences worsening redness, swelling, or increased pain in the left lower leg.   Patient is in agreement with this plan of care and verbalizes understanding.  All questions were answered.  Patient stable for discharge.   Final Clinical Impressions(s) / UC Diagnoses   Final diagnoses:  Dermatitis of lower extremity     Discharge Instructions      Take medication as prescribed. May take over-the-counter Tylenol or ibuprofen as needed for pain, fever, general discomfort. Keep the lower extremities elevated above the level of the heart as much as possible. When you are working, recommend the use of compression socks to help with swelling and pain of your lower extremities. As discussed, it is recommended that you follow-up with your primary care physician as soon as possible for reevaluation. If you experience worsening pain in the left lower leg, with redness, swelling, or other concerns, please go to the emergency department immediately for further evaluation. Follow-up as needed.     ED Prescriptions     Medication Sig Dispense Auth. Provider   doxycycline (VIBRA-TABS) 100 MG tablet Take 1 tablet (100 mg total) by mouth 2 (two) times daily for 5 days. 10 tablet Ayden Apodaca-Warren, Sadie Haber, NP      PDMP not reviewed this encounter.   Abran Cantor, NP 12/08/22 1451

## 2022-12-08 NOTE — ED Triage Notes (Signed)
Bilateral lower leg redness and  left lower leg swelling x 4 days.  Left leg started first.  Feels warm to the touch.

## 2022-12-17 DIAGNOSIS — E782 Mixed hyperlipidemia: Secondary | ICD-10-CM | POA: Diagnosis not present

## 2022-12-17 DIAGNOSIS — E559 Vitamin D deficiency, unspecified: Secondary | ICD-10-CM | POA: Diagnosis not present

## 2022-12-17 DIAGNOSIS — I10 Essential (primary) hypertension: Secondary | ICD-10-CM | POA: Diagnosis not present

## 2022-12-17 DIAGNOSIS — R7303 Prediabetes: Secondary | ICD-10-CM | POA: Diagnosis not present

## 2022-12-23 ENCOUNTER — Other Ambulatory Visit (HOSPITAL_COMMUNITY): Payer: Self-pay | Admitting: Family Medicine

## 2022-12-23 DIAGNOSIS — D509 Iron deficiency anemia, unspecified: Secondary | ICD-10-CM | POA: Diagnosis not present

## 2022-12-23 DIAGNOSIS — F17210 Nicotine dependence, cigarettes, uncomplicated: Secondary | ICD-10-CM | POA: Diagnosis not present

## 2022-12-23 DIAGNOSIS — H6993 Unspecified Eustachian tube disorder, bilateral: Secondary | ICD-10-CM | POA: Diagnosis not present

## 2022-12-23 DIAGNOSIS — I10 Essential (primary) hypertension: Secondary | ICD-10-CM | POA: Diagnosis not present

## 2022-12-23 DIAGNOSIS — F411 Generalized anxiety disorder: Secondary | ICD-10-CM | POA: Diagnosis not present

## 2022-12-23 DIAGNOSIS — F32A Depression, unspecified: Secondary | ICD-10-CM | POA: Diagnosis not present

## 2022-12-23 DIAGNOSIS — E669 Obesity, unspecified: Secondary | ICD-10-CM | POA: Diagnosis not present

## 2022-12-23 DIAGNOSIS — F17200 Nicotine dependence, unspecified, uncomplicated: Secondary | ICD-10-CM | POA: Diagnosis not present

## 2022-12-23 DIAGNOSIS — G43009 Migraine without aura, not intractable, without status migrainosus: Secondary | ICD-10-CM | POA: Diagnosis not present

## 2022-12-23 DIAGNOSIS — Z0001 Encounter for general adult medical examination with abnormal findings: Secondary | ICD-10-CM | POA: Diagnosis not present

## 2022-12-23 DIAGNOSIS — Z1231 Encounter for screening mammogram for malignant neoplasm of breast: Secondary | ICD-10-CM

## 2022-12-23 DIAGNOSIS — Z0000 Encounter for general adult medical examination without abnormal findings: Secondary | ICD-10-CM | POA: Diagnosis not present

## 2022-12-23 DIAGNOSIS — G4700 Insomnia, unspecified: Secondary | ICD-10-CM | POA: Diagnosis not present

## 2023-01-05 ENCOUNTER — Emergency Department (HOSPITAL_COMMUNITY): Payer: 59

## 2023-01-05 ENCOUNTER — Encounter (HOSPITAL_COMMUNITY): Payer: Self-pay | Admitting: *Deleted

## 2023-01-05 ENCOUNTER — Other Ambulatory Visit: Payer: Self-pay

## 2023-01-05 ENCOUNTER — Emergency Department (HOSPITAL_COMMUNITY)
Admission: EM | Admit: 2023-01-05 | Discharge: 2023-01-05 | Disposition: A | Discharge status: Home / Self Care | Payer: 59 | Attending: Emergency Medicine | Admitting: Emergency Medicine

## 2023-01-05 DIAGNOSIS — W208XXA Other cause of strike by thrown, projected or falling object, initial encounter: Secondary | ICD-10-CM | POA: Insufficient documentation

## 2023-01-05 DIAGNOSIS — M79671 Pain in right foot: Secondary | ICD-10-CM | POA: Diagnosis not present

## 2023-01-05 DIAGNOSIS — M19071 Primary osteoarthritis, right ankle and foot: Secondary | ICD-10-CM | POA: Diagnosis not present

## 2023-01-05 DIAGNOSIS — M2011 Hallux valgus (acquired), right foot: Secondary | ICD-10-CM | POA: Diagnosis not present

## 2023-01-05 DIAGNOSIS — M7731 Calcaneal spur, right foot: Secondary | ICD-10-CM | POA: Diagnosis not present

## 2023-01-05 NOTE — Discharge Instructions (Addendum)
Your exam was reassuring.  No concerning findings on the x-ray.  Follow-up with your podiatrist.  For any concerning symptoms return to the emergency room.  We discussed sending Lodine into the pharmacy for you however since you are allergic to anti-inflammatory medications you can instead take Tylenol 1000 mg every 6 hours.  Use cold compress for 10 to 15 minutes every 4-5 hours.  Follow-up with the podiatrist.

## 2023-01-05 NOTE — ED Provider Notes (Signed)
[place] EMERGENCY DEPARTMENT AT [facility] Provider Note   CSN: [id number] Arrival date & time: 01/05/23  1252     History  Chief Complaint  Patient presents with   Foot Pain    Erin Vaughn is a 52 y.o. female.  52 year old female presents today for concern of right foot pain.  Has been going on since the past 4 days.  She states around Tuesday she did dropped a 5 pound box on her foot but not from a significant height.  No other injuries.  States it is painful to ambulate.  No fever.  The history is provided by the patient. No language interpreter was used.       Home Medications Prior to Admission medications   Medication Sig Start Date End Date Taking? Authorizing Provider  ALPRAZolam Prudy Feeler) 0.5 MG tablet Take 0.5 mg by mouth 2 (two) times daily as needed. 10/19/20   [provider]  BIOTIN 5000 PO Take 1 tablet by mouth daily.    [provider]  buPROPion (WELLBUTRIN XL) 300 MG 24 hr tablet Take 300 mg by mouth daily. 08/07/21   [provider]  Calcium Carbonate (CALCIUM 500 PO) Take 1 tablet by mouth. 1 tablet per week    [provider]  calcium carbonate (OS-CAL) 1250 (500 Ca) MG chewable tablet Chew by mouth.    [provider]  Cholecalciferol (VITAMIN D3) 25 MCG (1000 UT) CHEW Chew 2,000 Units by mouth in the morning and at bedtime.    [provider]  COLLAGEN PO Take 50 mg by mouth 2 (two) times daily.    [provider]  Cyanocobalamin (VITAMIN B 12 PO) Take 1,000 mg by mouth 2 (two) times daily.    [provider]  fluticasone (FLONASE) 50 MCG/ACT nasal spray Place 1 spray into both nostrils daily as needed for allergies. 11/16/16   [provider]  gabapentin (NEURONTIN) 600 MG tablet Take 600 mg by mouth 2 (two) times daily.  11/25/16   [provider]  montelukast (SINGULAIR) 10 MG tablet Take by mouth. 11/17/19   [provider]  Multiple Vitamin  (MULTIVITAMIN) tablet Take 2 tablets by mouth daily. "[employer] copy, Historical, MD  Omega-3 Fatty Acids (OMEGA-3 FISH OIL) 1200 MG CAPS Take 1 capsule by mouth daily.    [provider]  pantoprazole (PROTONIX) 40 MG tablet pantoprazole 40 mg tablet,delayed release  Take 1 tablet twice a day by oral route. 10/07/19   [provider]  Theanine 200 MG CAPS Take 200 mg by mouth 2 (two) times daily.    [provider]  vitamin C (ASCORBIC ACID) 250 MG tablet Take 250 mg by mouth 2 (two) times daily.    [provider]      Allergies    Penicillins, Zithromax [azithromycin], Nsaids, Vancomycin, and Adhesive [tape]    Review of Systems   Review of Systems  Musculoskeletal:  Positive for arthralgias. Negative for joint swelling.  All other systems reviewed and are negative.   Physical Exam Updated Vital Signs BP (!) 145/68   Pulse 80   Temp 97.8 F (36.6 C) (Oral)   Ht 5\' 3"  (1.6 m)   Wt 75.4 kg   SpO2 99%   BMI 29.46 kg/m  Physical Exam Vitals and nursing note reviewed.  Constitutional:      General: She is not in acute distress.    Appearance: Normal appearance. She is not ill-appearing.  HENT:     Head: Normocephalic and atraumatic.     Nose: Nose normal.  Eyes:     Conjunctiva/sclera: Conjunctivae normal.  Pulmonary:     Effort: Pulmonary effort is normal. No respiratory distress.  Musculoskeletal:        General: No deformity. Normal range of motion.     Comments: Full range of motion in the right ankle, all digits of the right foot.  Neurovascularly intact.  Tenderness to palpation present over the forefoot as well as the right lateral aspect.  No erythema or edema.  Skin:    Findings: No rash.  Neurological:     Mental Status: She is alert.     ED Results / Procedures / Treatments   Labs (all labs ordered are listed, but only abnormal results are displayed) Labs Reviewed - No data to display  EKG None  Radiology DG  Foot Complete Right  Result Date: 01/05/2023 CLINICAL DATA:  Pain EXAM: RIGHT FOOT COMPLETE - 3+ VIEW COMPARISON:  None Available. FINDINGS: There is no acute fracture or dislocation. Plantar calcaneal spur is present. There are calcifications in the origin of the insertion of the Achilles tendon. There is mild hallux valgus and mild degenerative change of the first metatarsophalangeal joint. Soft tissues are within normal limits. IMPRESSION: 1. No acute fracture or dislocation. 2. Mild hallux valgus and degenerative change of the first metatarsophalangeal joint. 3. Plantar calcaneal spur. Electronically Signed   By: Darliss Cheney M.D.   On: 01/05/2023 17:14    Procedures Procedures    Medications Ordered in ED Medications - No data to display  ED Course/ Medical Decision Making/ A&P                                 Medical Decision Making Amount and/or Complexity of Data Reviewed Radiology: ordered.   52 year old female presents today for evaluation of right foot pain.  X-ray obtained.  No acute bony findings.  She is established with a podiatrist.  She will call to schedule an appointment with him.  No signs of infection.  No erythema, edema, and patient is afebrile.  Neurovascularly intact.  Exam overall reassuring.  Will prescribe Lodine.  Discussed cold compress.  Patient is in agreement.  Discharged in stable condition.   Final Clinical Impression(s) / ED Diagnoses Final diagnoses:  Foot pain, right    Rx / DC Orders ED Discharge Orders     None         Marita Kansas, PA-C 01/05/23 1733    Loetta Rough, MD 01/05/23 [phone]

## 2023-01-05 NOTE — ED Triage Notes (Signed)
Pt stated that her right foot has been swelling and painful for the last 4 days. Pt does have obvious swelling across the top of her foot as well as redness. No edema present.

## 2023-07-31 ENCOUNTER — Encounter: Payer: Self-pay | Admitting: Nurse Practitioner

## 2023-07-31 ENCOUNTER — Telehealth: Payer: Self-pay

## 2023-07-31 ENCOUNTER — Other Ambulatory Visit: Payer: Self-pay

## 2023-07-31 NOTE — Telephone Encounter (Signed)
 Auth Submission: NO AUTH NEEDED Site of care: Site of care: [facility] Payer: [facility] Medication & CPT/J Code(s) submitted: Venofer (Iron Sucrose) J1756 Route of submission (phone, fax, portal): portal Phone # Fax # Auth type: Buy/Bill PB Units/visits requested: 300mg  3 doses Reference number:  Approval from: 07/31/23 to 01/18/24

## 2023-08-01 ENCOUNTER — Encounter: Payer: Self-pay | Admitting: Nurse Practitioner

## 2023-08-04 ENCOUNTER — Ambulatory Visit

## 2023-08-07 ENCOUNTER — Encounter: Attending: Nurse Practitioner | Admitting: Emergency Medicine

## 2023-08-07 VITALS — BP 150/82 | HR 86 | Temp 98.3°F | Resp 18

## 2023-08-07 DIAGNOSIS — D509 Iron deficiency anemia, unspecified: Secondary | ICD-10-CM

## 2023-08-07 MED ORDER — ACETAMINOPHEN 325 MG PO TABS
650.0000 mg | ORAL_TABLET | Freq: Once | ORAL | Status: AC
  Administered 2023-08-07: 650 mg via ORAL

## 2023-08-07 MED ORDER — DIPHENHYDRAMINE HCL 25 MG PO CAPS
25.0000 mg | ORAL_CAPSULE | Freq: Once | ORAL | Status: AC
  Administered 2023-08-07: 25 mg via ORAL

## 2023-08-07 MED ORDER — SODIUM CHLORIDE 0.9 % IV SOLN
300.0000 mg | Freq: Once | INTRAVENOUS | Status: AC
  Administered 2023-08-07: 300 mg via INTRAVENOUS
  Filled 2023-08-07: qty 15

## 2023-08-07 NOTE — Progress Notes (Signed)
 Diagnosis: Iron Deficiency Anemia  Provider:  Micael Hampshire, FNP    Procedure: IV Infusion  IV type: peripheral, IV Location: L Antecubital  Venofer (Iron Sucrose), Dose: 300 mg  Infusion Start Time: 1340  Infusion Stop Time: 1523  Post Infusion IV Care: Observation period completed and Peripheral IV Discontinued  Discharge: Condition: Good, Destination: Home . AVS Provided  Performed by:  Arrie Senate, RN

## 2023-08-12 ENCOUNTER — Ambulatory Visit

## 2023-08-12 MED ORDER — ACETAMINOPHEN 325 MG PO TABS
650.0000 mg | ORAL_TABLET | Freq: Once | ORAL | Status: AC
Start: 2023-08-12 — End: ?

## 2023-08-12 MED ORDER — DIPHENHYDRAMINE HCL 25 MG PO CAPS
25.0000 mg | ORAL_CAPSULE | Freq: Once | ORAL | Status: AC
Start: 2023-08-12 — End: ?

## 2023-08-12 MED ORDER — IRON SUCROSE 20 MG/ML IV SOLN
300.0000 mg | Freq: Once | INTRAVENOUS | Status: AC
  Filled 2023-08-12: qty 15

## 2023-08-13 ENCOUNTER — Other Ambulatory Visit (HOSPITAL_COMMUNITY): Payer: Self-pay | Admitting: Nurse Practitioner

## 2023-08-13 DIAGNOSIS — Z808 Family history of malignant neoplasm of other organs or systems: Secondary | ICD-10-CM

## 2023-08-14 ENCOUNTER — Ambulatory Visit

## 2023-08-17 ENCOUNTER — Ambulatory Visit: Admission: EM | Admit: 2023-08-17 | Discharge: 2023-08-17 | Disposition: A | Discharge status: Home / Self Care

## 2023-08-17 DIAGNOSIS — H1032 Unspecified acute conjunctivitis, left eye: Secondary | ICD-10-CM | POA: Diagnosis not present

## 2023-08-17 MED ORDER — POLYMYXIN B-TRIMETHOPRIM 10000-0.1 UNIT/ML-% OP SOLN: 1.0000 [drp] | Freq: Four times a day (QID) | OPHTHALMIC | 0 refills | Status: AC

## 2023-08-17 NOTE — ED Triage Notes (Signed)
 Pt reports redness watering and irritation to the left eye onset yesterday, went to wipe the eye and a green residue came from it pain and swelling, is present woke up this morning with her eye matted shut. Pt has tried to eye drops and cold compresses to the eye.

## 2023-08-17 NOTE — Discharge Instructions (Signed)
 Use eyedrops as prescribed.  Recommend using them in both eyes in the event that the infection spreads. Continue warm compresses to the affected eye to help with pain and discomfort. You may use cool compresses to the eyes to help with pain or swelling. May continue the over-the-counter eyedrops you are using to help keep the eyes moist and decreased redness. Strict handwashing when applying medication.  Avoid rubbing or manipulating the eyes while symptoms persist. Discard any eye make-up that were using prior to your symptoms starting. Follow-up if symptoms do not improve.

## 2023-08-17 NOTE — ED Provider Notes (Signed)
 [facility]    CSN: [id number] Arrival date & time: 08/17/23  1323      History   Chief Complaint No chief complaint on file.   HPI KYMARI Vaughn is a 53 y.o. female.   The history is provided by the patient.   Patient presents for complaints of redness, watering, and green mucus from the left eye that started over the past 24 hours.  Patient states she is also developed pain and swelling to the left eye.  Patient states when she woke up this morning her left eye was "red shit."  Patient denies fever, chills, loss of vision, headache, light sensitivity.  States that she does wear glasses.  She denies any obvious known sick contacts, but reports she did go to a birthday party on yesterday.  States that she has been applying both warm and cool compresses, and has used allergy eyedrops for her symptoms.  Past Medical History:  Diagnosis Date   Achilles tendon rupture    2014   Anxiety    Asthma    adult onset no problems   Complication of anesthesia 2001   woke up during gallbladder surgery   Depression    GERD (gastroesophageal reflux disease)    History of MRSA infection 2010   right side Abdomen   Hypertension    Migraine    hx   PONV (postoperative nausea and vomiting)    prior surger 14 none during gallbladder 2000    Patient Active Problem List   Diagnosis Date Noted   Hidradenitis suppurativa 11/06/2020   Ingrowing toenail 11/06/2020   Iron deficiency anemia 11/06/2020   Abnormal liver function tests 11/05/2020   Chronic low back pain 11/05/2020   Generalized anxiety disorder 11/05/2020   Seasonal allergic rhinitis 11/05/2020   Essential hypertension 11/01/2020   Gastroesophageal reflux disease without esophagitis 11/01/2020   Postoperative nausea and vomiting 07/19/2020   Former smoker 07/04/2020   Hypertriglyceridemia 01/27/2020   Nicotine dependence 01/27/2020   Prediabetes 01/27/2020   Vitamin D deficiency 01/27/2020   OSA (obstructive  sleep apnea) 01/20/2020   Morbid obesity due to excess calories (HCC) 11/30/2019   S/P lumbar spinal fusion 02/20/2017   Rupture of right Achilles tendon 05/10/2013   Achilles tendon tear 05/10/2013    Past Surgical History:  Procedure Laterality Date   ACHILLES TENDON SURGERY Right 05/10/2013   Procedure: RIGHT ACHILLES TENDON REPAIR;  Surgeon: Javier Docker, MD;  Location: [facility];  Service: Orthopedics;  Laterality: Right;  achilles tendon   CHOLECYSTECTOMY  2001    OB History     Gravida  4   Para  1   Term  1   Preterm      AB  3   Living  1      SAB  3   IAB      Ectopic      Multiple      Live Births               Home Medications    Prior to Admission medications   Medication Sig Start Date End Date Taking? Authorizing Provider  trimethoprim-polymyxin b (POLYTRIM) ophthalmic solution Place 1 drop into both eyes every 6 (six) hours for 7 days. 08/17/23 08/24/23 Yes Leath-Warren, Sadie Haber, NP  ALPRAZolam Prudy Feeler) 0.5 MG tablet Take 0.5 mg by mouth 2 (two) times daily as needed. 10/19/20   [provider]  BIOTIN 5000 PO Take 1 tablet by mouth daily.  [provider]  buPROPion (WELLBUTRIN XL) 300 MG 24 hr tablet Take 300 mg by mouth daily. 08/07/21   [provider]  Calcium Carbonate (CALCIUM 500 PO) Take 1 tablet by mouth. 1 tablet per week    [provider]  calcium carbonate (OS-CAL) 1250 (500 Ca) MG chewable tablet Chew by mouth.    [provider]  cariprazine (VRAYLAR) 1.5 MG capsule Take 1.5 mg by mouth daily.    [provider]  Cholecalciferol (VITAMIN D3) 25 MCG (1000 UT) CHEW Chew 2,000 Units by mouth in the morning and at bedtime.    [provider]  COLLAGEN PO Take 50 mg by mouth 2 (two) times daily.    [provider]  Cyanocobalamin (VITAMIN B 12 PO) Take 1,000 mg by mouth 2 (two) times daily.    [provider]  fluticasone (FLONASE) 50 MCG/ACT nasal  spray Place 1 spray into both nostrils daily as needed for allergies. 11/16/16   [provider]  gabapentin (NEURONTIN) 600 MG tablet Take 600 mg by mouth 2 (two) times daily.  11/25/16   [provider]  montelukast (SINGULAIR) 10 MG tablet Take by mouth. 11/17/19   [provider]  Multiple Vitamin (MULTIVITAMIN) tablet Take 2 tablets by mouth daily. "[employer] copy, Historical, MD  Omega-3 Fatty Acids (OMEGA-3 FISH OIL) 1200 MG CAPS Take 1 capsule by mouth daily.    [provider]  pantoprazole (PROTONIX) 40 MG tablet pantoprazole 40 mg tablet,delayed release  Take 1 tablet twice a day by oral route. 10/07/19   [provider]  Theanine 200 MG CAPS Take 200 mg by mouth 2 (two) times daily.    [provider]  vitamin C (ASCORBIC ACID) 250 MG tablet Take 250 mg by mouth 2 (two) times daily.    [provider]    Family History Family History  Problem Relation Age of Onset   Cancer Mother    COPD Other    Hypertension Other    Asthma Other     Social History Social History   Tobacco Use   Smoking status: Every Day    Current packs/day: 0.50    Average packs/day: 0.5 packs/day for 15.0 years (7.5 ttl pk-yrs)    Types: Cigarettes   Smokeless tobacco: Never   Tobacco comments:    PCP is working with patient  Vaping Use   Vaping status: Former  Substance Use Topics   Alcohol use: No   Drug use: No     Allergies   Penicillins, Zithromax [azithromycin], Nsaids, Vancomycin, and Adhesive [tape]   Review of Systems Review of Systems Per HPI  Physical Exam Triage Vital Signs ED Triage Vitals  Encounter Vitals Group     BP 08/17/23 1334 (!) 157/89     Systolic BP Percentile --      Diastolic BP Percentile --      Pulse Rate 08/17/23 1334 84     Resp 08/17/23 1334 17     Temp 08/17/23 1334 98.5 F (36.9 C)     Temp Source 08/17/23 1334 Oral     SpO2 08/17/23 1334 98 %     Weight --      Height --       Head Circumference --      Peak Flow --      Pain Score 08/17/23 1338 4     Pain Loc --      Pain Education --  Exclude from Growth Chart --    No data found.  Updated Vital Signs BP (!) 157/89 (BP Location: Right Arm)   Pulse 84   Temp 98.5 F (36.9 C) (Oral)   Resp 17   SpO2 98%   Visual Acuity Right Eye Distance: 20/25 (pt wearing corrective lenses) Left Eye Distance: 20/40 (pt wearing corrective lenses) Bilateral Distance: 20/25 (pt wearing corrective lenses)  Right Eye Near:   Left Eye Near:    Bilateral Near:     Physical Exam Vitals and nursing note reviewed.  Constitutional:      General: She is not in acute distress.    Appearance: Normal appearance.  HENT:     Head: Normocephalic.     Right Ear: Tympanic membrane, ear canal and external ear normal.     Left Ear: Tympanic membrane, ear canal and external ear normal.     Nose: Congestion present.     Mouth/Throat:     Mouth: Mucous membranes are moist.  Eyes:     General: Lids are normal. Vision grossly intact. No visual field deficit.    Extraocular Movements: Extraocular movements intact.     Left eye: Normal extraocular motion and no nystagmus.     Conjunctiva/sclera:     Left eye: Left conjunctiva is injected. Exudate present.     Pupils: Pupils are equal, round, and reactive to light.  Pulmonary:     Effort: Pulmonary effort is normal.  Musculoskeletal:     Cervical back: Normal range of motion.  Skin:    General: Skin is warm and dry.  Neurological:     General: No focal deficit present.     Mental Status: She is alert and oriented to person, place, and time.  Psychiatric:        Mood and Affect: Mood normal.        Behavior: Behavior normal.      UC Treatments / Results  Labs (all labs ordered are listed, but only abnormal results are displayed) Labs Reviewed - No data to display  EKG   Radiology No results found.  Procedures Procedures (including critical care  time)  Medications Ordered in UC Medications - No data to display  Initial Impression / Assessment and Plan / UC Course  I have reviewed the triage vital signs and the nursing notes.  Pertinent labs & imaging results that were available during my care of the patient were reviewed by me and considered in my medical decision making (see chart for details).  On exam, patient with injection of the left conjunctiva with exudate present.  Visual acuity is intact with correction.  Will treat for bacterial conjunctivitis with Polytrim eyedrops.  Supportive care recommendations were provided and discussed with the patient to include over-the-counter analgesics, warm or cool compresses to the eye, and strict hand hygiene.  Patient was given indications regarding follow-up.  Patient was in agreement with this plan of care and verbalized understanding.  All questions were answered.  Patient stable for discharge.  Work note was provided.  Final Clinical Impressions(s) / UC Diagnoses   Final diagnoses:  Acute bacterial conjunctivitis of left eye     Discharge Instructions      Use eyedrops as prescribed.  Recommend using them in both eyes in the event that the infection spreads. Continue warm compresses to the affected eye to help with pain and discomfort. You may use cool compresses to the eyes to help with pain or swelling. May continue the over-the-counter eyedrops  you are using to help keep the eyes moist and decreased redness. Strict handwashing when applying medication.  Avoid rubbing or manipulating the eyes while symptoms persist. Discard any eye make-up that were using prior to your symptoms starting. Follow-up if symptoms do not improve.      ED Prescriptions     Medication Sig Dispense Auth. Provider   trimethoprim-polymyxin b (POLYTRIM) ophthalmic solution Place 1 drop into both eyes every 6 (six) hours for 7 days. 10 mL Leath-Warren, Sadie Haber, NP      PDMP not reviewed this  encounter.   Abran Cantor, NP 08/17/23 1410

## 2023-08-19 ENCOUNTER — Ambulatory Visit

## 2023-08-19 ENCOUNTER — Encounter (HOSPITAL_COMMUNITY): Payer: Self-pay

## 2023-08-19 ENCOUNTER — Ambulatory Visit (HOSPITAL_COMMUNITY)

## 2023-08-27 ENCOUNTER — Other Ambulatory Visit: Payer: Self-pay

## 2023-08-27 ENCOUNTER — Ambulatory Visit

## 2023-08-27 ENCOUNTER — Ambulatory Visit
Admission: EM | Admit: 2023-08-27 | Discharge: 2023-08-27 | Disposition: A | Discharge status: Home / Self Care | Attending: Nurse Practitioner | Admitting: Nurse Practitioner

## 2023-08-27 ENCOUNTER — Encounter: Payer: Self-pay | Admitting: Emergency Medicine

## 2023-08-27 DIAGNOSIS — H109 Unspecified conjunctivitis: Secondary | ICD-10-CM

## 2023-08-27 MED ORDER — BACITRACIN-POLYMYXIN B 500-10000 UNIT/GM OP OINT: 1.0000 | TOPICAL_OINTMENT | OPHTHALMIC | 0 refills | Status: AC

## 2023-08-27 NOTE — ED Triage Notes (Addendum)
 Pt reports history of similar since end of march and reports pain,redness,eye drainage have gone away, moved to another eye, and returned to left eye with light senstivity x3 days ago. Denies any known injury. Wears glasses at baseline.

## 2023-08-27 NOTE — Discharge Instructions (Signed)
 Using Polysporin eye ointment every 4 hours for 7 days to the left lower eyelid.  Seek care if symptoms do not improve with treatment.

## 2023-08-27 NOTE — ED Provider Notes (Signed)
 [facility]    CSN: [id number] Arrival date & time: 08/27/23  1002      History   Chief Complaint Chief Complaint  Patient presents with   Eye Drainage    HPI Erin Vaughn is a 53 y.o. female.   Patient presents today with 3 day history of left eye redness, drainage, itching, and light sensitivity.  Reports a couple of weeks ago she was treated for bacterial conjunctivitis with eye drops which helped temporarily, but then symptoms recurred.  Reports she has been cleaning the eye with warm compresses and clean wash cloths without improvement.  No recent cough, congestion, sore throat, or fever.  No foreign body sensation or contact lens use.  Wears glasses at baseline, no significant vision changes.     Past Medical History:  Diagnosis Date   Achilles tendon rupture    2014   Anxiety    Asthma    adult onset no problems   Complication of anesthesia 2001   woke up during gallbladder surgery   Depression    GERD (gastroesophageal reflux disease)    History of MRSA infection 2010   right side Abdomen   Hypertension    Migraine    hx   PONV (postoperative nausea and vomiting)    prior surger 14 none during gallbladder 2000    Patient Active Problem List   Diagnosis Date Noted   Hidradenitis suppurativa 11/06/2020   Ingrowing toenail 11/06/2020   Iron deficiency anemia 11/06/2020   Abnormal liver function tests 11/05/2020   Chronic low back pain 11/05/2020   Generalized anxiety disorder 11/05/2020   Seasonal allergic rhinitis 11/05/2020   Essential hypertension 11/01/2020   Gastroesophageal reflux disease without esophagitis 11/01/2020   Postoperative nausea and vomiting 07/19/2020   Former smoker 07/04/2020   Hypertriglyceridemia 01/27/2020   Nicotine dependence 01/27/2020   Prediabetes 01/27/2020   Vitamin D deficiency 01/27/2020   OSA (obstructive sleep apnea) 01/20/2020   Morbid obesity due to excess calories (HCC) 11/30/2019   S/P lumbar  spinal fusion 02/20/2017   Rupture of right Achilles tendon 05/10/2013   Achilles tendon tear 05/10/2013    Past Surgical History:  Procedure Laterality Date   ACHILLES TENDON SURGERY Right 05/10/2013   Procedure: RIGHT ACHILLES TENDON REPAIR;  Surgeon: Javier Docker, MD;  Location: [facility];  Service: Orthopedics;  Laterality: Right;  achilles tendon   CHOLECYSTECTOMY  2001    OB History     Gravida  4   Para  1   Term  1   Preterm      AB  3   Living  1      SAB  3   IAB      Ectopic      Multiple      Live Births               Home Medications    Prior to Admission medications   Medication Sig Start Date End Date Taking? Authorizing Provider  bacitracin-polymyxin b (POLYSPORIN) ophthalmic ointment Place 1 Application into the left eye every 4 (four) hours for 7 days. apply half-inch ribbon to lower eyelid 08/27/23 09/03/23 Yes Valentino Nose, NP  ALPRAZolam Prudy Feeler) 0.5 MG tablet Take 0.5 mg by mouth 2 (two) times daily as needed. 10/19/20   [provider]  BIOTIN 5000 PO Take 1 tablet by mouth daily.    [provider]  buPROPion (WELLBUTRIN XL) 300 MG 24 hr tablet Take 300 mg  by mouth daily. 08/07/21   [provider]  Calcium Carbonate (CALCIUM 500 PO) Take 1 tablet by mouth. 1 tablet per week    [provider]  calcium carbonate (OS-CAL) 1250 (500 Ca) MG chewable tablet Chew by mouth.    [provider]  cariprazine (VRAYLAR) 1.5 MG capsule Take 3 mg by mouth daily.    [provider]  Cholecalciferol (VITAMIN D3) 25 MCG (1000 UT) CHEW Chew 2,000 Units by mouth in the morning and at bedtime.    [provider]  COLLAGEN PO Take 50 mg by mouth 2 (two) times daily.    [provider]  Cyanocobalamin (VITAMIN B 12 PO) Take 1,000 mg by mouth 2 (two) times daily.    [provider]  fluticasone (FLONASE) 50 MCG/ACT nasal spray Place 1 spray into both nostrils daily as needed  for allergies. 11/16/16   [provider]  gabapentin (NEURONTIN) 600 MG tablet Take 600 mg by mouth 2 (two) times daily.  11/25/16   [provider]  montelukast (SINGULAIR) 10 MG tablet Take by mouth. 11/17/19   [provider]  Multiple Vitamin (MULTIVITAMIN) tablet Take 2 tablets by mouth daily. "[employer] copy, Historical, MD  Omega-3 Fatty Acids (OMEGA-3 FISH OIL) 1200 MG CAPS Take 1 capsule by mouth daily.    [provider]  pantoprazole (PROTONIX) 40 MG tablet pantoprazole 40 mg tablet,delayed release  Take 1 tablet twice a day by oral route. 10/07/19   [provider]  Theanine 200 MG CAPS Take 200 mg by mouth 2 (two) times daily.    [provider]  vitamin C (ASCORBIC ACID) 250 MG tablet Take 250 mg by mouth 2 (two) times daily.    [provider]    Family History Family History  Problem Relation Age of Onset   Cancer Mother    COPD Other    Hypertension Other    Asthma Other     Social History Social History   Tobacco Use   Smoking status: Every Day    Current packs/day: 0.50    Average packs/day: 0.5 packs/day for 15.0 years (7.5 ttl pk-yrs)    Types: Cigarettes   Smokeless tobacco: Never   Tobacco comments:    PCP is working with patient  Vaping Use   Vaping status: Former  Substance Use Topics   Alcohol use: No   Drug use: No     Allergies   Penicillins, Zithromax [azithromycin], Nsaids, Vancomycin, and Adhesive [tape]   Review of Systems Review of Systems Per HPI  Physical Exam Triage Vital Signs ED Triage Vitals  Encounter Vitals Group     BP 08/27/23 1025 (!) 173/88     Systolic BP Percentile --      Diastolic BP Percentile --      Pulse Rate 08/27/23 1025 74     Resp 08/27/23 1025 20     Temp 08/27/23 1025 97.8 F (36.6 C)     Temp Source 08/27/23 1025 Oral     SpO2 08/27/23 1025 99 %     Weight --      Height --      Head Circumference --      Peak Flow --      Pain  Score 08/27/23 1024 10     Pain Loc --      Pain Education --      Exclude from Growth Chart --    No data found.  Updated Vital Signs BP (!) 173/88 (BP Location: Right Arm)   Pulse 74   Temp 97.8 F (36.6 C) (Oral)   Resp 20   SpO2 99%   Visual Acuity Right Eye Distance:   Left Eye Distance:   Bilateral Distance:    Right Eye Near:   Left Eye Near:    Bilateral Near:     Physical Exam Vitals and nursing note reviewed.  Constitutional:      General: She is not in acute distress.    Appearance: Normal appearance. She is not toxic-appearing.  HENT:     Head: Normocephalic and atraumatic. No right periorbital erythema or left periorbital erythema.     Right Ear: External ear normal.     Left Ear: External ear normal.     Nose: Nose normal. No congestion or rhinorrhea.     Mouth/Throat:     Mouth: Mucous membranes are moist.     Pharynx: Oropharynx is clear.  Eyes:     General: No scleral icterus.       Right eye: No discharge.        Left eye: Discharge present.    Extraocular Movements: Extraocular movements intact.     Conjunctiva/sclera:     Left eye: Left conjunctiva is injected. Exudate present. No hemorrhage.    Pupils: Pupils are equal, round, and reactive to light.  Neurological:     Mental Status: She is alert.      UC Treatments / Results  Labs (all labs ordered are listed, but only abnormal results are displayed) Labs Reviewed - No data to display  EKG   Radiology No results found.  Procedures Procedures (including critical care time)  Medications Ordered in UC Medications - No data to display  Initial Impression / Assessment and Plan / UC Course  I have reviewed the triage vital signs and the nursing notes.  Pertinent labs & imaging results that were available during my care of the patient were reviewed by me and considered in my medical decision making (see chart for details).   Patient is mildly hypertensive in triage today,  otherwise vital signs are stable.  1. Bacterial conjunctivitis Treat with Polysporin ointment every 4 hours for 7 days Supportive care discussed Work excuse provided   The patient was given the opportunity to ask questions.  All questions answered to their satisfaction.  The patient is in agreement to this plan.   Final Clinical Impressions(s) / UC Diagnoses   Final diagnoses:  Bacterial conjunctivitis     Discharge Instructions      Using Polysporin eye ointment every 4 hours for 7 days to the left lower eyelid.  Seek care if symptoms do not improve with treatment.    ED Prescriptions     Medication Sig Dispense Auth. Provider   bacitracin-polymyxin b (POLYSPORIN) ophthalmic ointment Place 1 Application into the left eye every 4 (four) hours for 7 days. apply half-inch ribbon to lower eyelid 3.5 g Valentino Nose, NP      PDMP not reviewed this encounter.   Valentino Nose, NP 08/27/23 1105

## 2023-09-02 ENCOUNTER — Ambulatory Visit

## 2023-09-03 ENCOUNTER — Ambulatory Visit
Admission: EM | Admit: 2023-09-03 | Discharge: 2023-09-03 | Disposition: A | Discharge status: Home / Self Care | Attending: Nurse Practitioner | Admitting: Nurse Practitioner

## 2023-09-03 DIAGNOSIS — N3000 Acute cystitis without hematuria: Secondary | ICD-10-CM | POA: Diagnosis present

## 2023-09-03 LAB — POCT URINALYSIS DIP (MANUAL ENTRY)
Bilirubin, UA: NEGATIVE
Glucose, UA: 100 mg/dL — AB
Ketones, POC UA: NEGATIVE mg/dL
Nitrite, UA: POSITIVE — AB
Protein Ur, POC: 100 mg/dL — AB
Spec Grav, UA: 1.02
Urobilinogen, UA: 2 U/dL — AB
pH, UA: 5

## 2023-09-03 MED ORDER — SULFAMETHOXAZOLE-TRIMETHOPRIM 800-160 MG PO TABS: 1.0000 | ORAL_TABLET | Freq: Two times a day (BID) | ORAL | 0 refills | Status: AC

## 2023-09-03 NOTE — ED Triage Notes (Signed)
 Pt reports burning with urination, abdominal pressure, lower back pain, frequent urination with urgency and only making small amounts x 2 days.

## 2023-09-03 NOTE — ED Provider Notes (Signed)
 [facility]    CSN: [id number] Arrival date & time: 09/03/23  1553      History   Chief Complaint No chief complaint on file.   HPI Erin Vaughn is a 53 y.o. female.   The history is provided by the patient.   Patient with a 2-day history of burning with urination, abdominal pressure, low back pain, urinary frequency/urgency, and decreased urine stream.  Patient denies fever, chills, chest pain, nausea, vomiting, diarrhea, hematuria, or vaginal symptoms.  Patient denies history of recurrent UTIs.  States last UTI was approximately "40 years ago."  She has not taken any medication for her symptoms.  Past Medical History:  Diagnosis Date   Achilles tendon rupture    2014   Anxiety    Asthma    adult onset no problems   Complication of anesthesia 2001   woke up during gallbladder surgery   Depression    GERD (gastroesophageal reflux disease)    History of MRSA infection 2010   right side Abdomen   Hypertension    Migraine    hx   PONV (postoperative nausea and vomiting)    prior surger 14 none during gallbladder 2000    Patient Active Problem List   Diagnosis Date Noted   Hidradenitis suppurativa 11/06/2020   Ingrowing toenail 11/06/2020   Iron deficiency anemia 11/06/2020   Abnormal liver function tests 11/05/2020   Chronic low back pain 11/05/2020   Generalized anxiety disorder 11/05/2020   Seasonal allergic rhinitis 11/05/2020   Essential hypertension 11/01/2020   Gastroesophageal reflux disease without esophagitis 11/01/2020   Postoperative nausea and vomiting 07/19/2020   Former smoker 07/04/2020   Hypertriglyceridemia 01/27/2020   Nicotine dependence 01/27/2020   Prediabetes 01/27/2020   Vitamin D deficiency 01/27/2020   OSA (obstructive sleep apnea) 01/20/2020   Morbid obesity due to excess calories (HCC) 11/30/2019   S/P lumbar spinal fusion 02/20/2017   Rupture of right Achilles tendon 05/10/2013   Achilles tendon tear 05/10/2013     Past Surgical History:  Procedure Laterality Date   ACHILLES TENDON SURGERY Right 05/10/2013   Procedure: RIGHT ACHILLES TENDON REPAIR;  Surgeon: Javier Docker, MD;  Location: [facility];  Service: Orthopedics;  Laterality: Right;  achilles tendon   CHOLECYSTECTOMY  2001    OB History     Gravida  4   Para  1   Term  1   Preterm      AB  3   Living  1      SAB  3   IAB      Ectopic      Multiple      Live Births               Home Medications    Prior to Admission medications   Medication Sig Start Date End Date Taking? Authorizing Provider  sulfamethoxazole-trimethoprim (BACTRIM DS) 800-160 MG tablet Take 1 tablet by mouth 2 (two) times daily for 7 days. 09/03/23 09/10/23 Yes Leath-Warren, Sadie Haber, NP  ALPRAZolam Prudy Feeler) 0.5 MG tablet Take 0.5 mg by mouth 2 (two) times daily as needed. 10/19/20   [provider]  bacitracin-polymyxin b (POLYSPORIN) ophthalmic ointment Place 1 Application into the left eye every 4 (four) hours for 7 days. apply half-inch ribbon to lower eyelid 08/27/23 09/03/23  Cathlean Marseilles A, NP  BIOTIN 5000 PO Take 1 tablet by mouth daily.    [provider]  buPROPion (WELLBUTRIN XL) 300 MG 24 hr tablet  Take 300 mg by mouth daily. 08/07/21   [provider]  Calcium Carbonate (CALCIUM 500 PO) Take 1 tablet by mouth. 1 tablet per week    [provider]  calcium carbonate (OS-CAL) 1250 (500 Ca) MG chewable tablet Chew by mouth.    [provider]  cariprazine (VRAYLAR) 1.5 MG capsule Take 3 mg by mouth daily.    [provider]  Cholecalciferol (VITAMIN D3) 25 MCG (1000 UT) CHEW Chew 2,000 Units by mouth in the morning and at bedtime.    [provider]  COLLAGEN PO Take 50 mg by mouth 2 (two) times daily.    [provider]  Cyanocobalamin (VITAMIN B 12 PO) Take 1,000 mg by mouth 2 (two) times daily.    [provider]  fluticasone (FLONASE) 50 MCG/ACT nasal  spray Place 1 spray into both nostrils daily as needed for allergies. 11/16/16   [provider]  gabapentin (NEURONTIN) 600 MG tablet Take 600 mg by mouth 2 (two) times daily.  11/25/16   [provider]  montelukast (SINGULAIR) 10 MG tablet Take by mouth. 11/17/19   [provider]  Multiple Vitamin (MULTIVITAMIN) tablet Take 2 tablets by mouth daily. "[employer] copy, Historical, MD  Omega-3 Fatty Acids (OMEGA-3 FISH OIL) 1200 MG CAPS Take 1 capsule by mouth daily.    [provider]  pantoprazole (PROTONIX) 40 MG tablet pantoprazole 40 mg tablet,delayed release  Take 1 tablet twice a day by oral route. 10/07/19   [provider]  Theanine 200 MG CAPS Take 200 mg by mouth 2 (two) times daily.    [provider]  vitamin C (ASCORBIC ACID) 250 MG tablet Take 250 mg by mouth 2 (two) times daily.    [provider]    Family History Family History  Problem Relation Age of Onset   Cancer Mother    COPD Other    Hypertension Other    Asthma Other     Social History Social History   Tobacco Use   Smoking status: Every Day    Current packs/day: 0.50    Average packs/day: 0.5 packs/day for 15.0 years (7.5 ttl pk-yrs)    Types: Cigarettes   Smokeless tobacco: Never   Tobacco comments:    PCP is working with patient  Vaping Use   Vaping status: Former  Substance Use Topics   Alcohol use: No   Drug use: No     Allergies   Penicillins, Zithromax [azithromycin], Nsaids, Vancomycin, and Adhesive [tape]   Review of Systems Review of Systems Per HPI  Physical Exam Triage Vital Signs ED Triage Vitals  Encounter Vitals Group     BP 09/03/23 1558 (!) 149/73     Systolic BP Percentile --      Diastolic BP Percentile --      Pulse Rate 09/03/23 1558 84     Resp 09/03/23 1558 18     Temp 09/03/23 1558 98 F (36.7 C)     Temp Source 09/03/23 1558 Oral     SpO2 09/03/23 1558 91 %     Weight --      Height --       Head Circumference --      Peak Flow --      Pain Score 09/03/23 1601 10     Pain Loc --      Pain Education --      Exclude from Growth Chart --  No data found.  Updated Vital Signs BP (!) 149/73 (BP Location: Right Arm)   Pulse 84   Temp 98 F (36.7 C) (Oral)   Resp 18   SpO2 91%   Visual Acuity Right Eye Distance:   Left Eye Distance:   Bilateral Distance:    Right Eye Near:   Left Eye Near:    Bilateral Near:     Physical Exam Vitals and nursing note reviewed.  Constitutional:      General: She is not in acute distress.    Appearance: Normal appearance.  HENT:     Head: Normocephalic.  Eyes:     Extraocular Movements: Extraocular movements intact.     Conjunctiva/sclera: Conjunctivae normal.     Pupils: Pupils are equal, round, and reactive to light.  Cardiovascular:     Rate and Rhythm: Normal rate and regular rhythm.     Pulses: Normal pulses.     Heart sounds: Normal heart sounds.  Pulmonary:     Effort: Pulmonary effort is normal. No respiratory distress.     Breath sounds: Normal breath sounds. No stridor. No wheezing, rhonchi or rales.  Abdominal:     General: Bowel sounds are normal.     Palpations: Abdomen is soft.     Tenderness: There is abdominal tenderness in the suprapubic area. There is right CVA tenderness and left CVA tenderness.  Musculoskeletal:     Cervical back: Normal range of motion.  Skin:    General: Skin is warm and dry.  Neurological:     General: No focal deficit present.     Mental Status: She is alert and oriented to person, place, and time.  Psychiatric:        Mood and Affect: Mood normal.        Behavior: Behavior normal.      UC Treatments / Results  Labs (all labs ordered are listed, but only abnormal results are displayed) Labs Reviewed  POCT URINALYSIS DIP (MANUAL ENTRY) - Abnormal; Notable for the following components:      Result Value   Color, UA orange (*)    Clarity, UA turbid (*)    Glucose, UA =100  (*)    Blood, UA moderate (*)    Protein Ur, POC =100 (*)    Urobilinogen, UA 2.0 (*)    Nitrite, UA Positive (*)    Leukocytes, UA Large (3+) (*)    All other components within normal limits  URINE CULTURE    EKG   Radiology No results found.  Procedures Procedures (including critical care time)  Medications Ordered in UC Medications - No data to display  Initial Impression / Assessment and Plan / UC Course  I have reviewed the triage vital signs and the nursing notes.  Pertinent labs & imaging results that were available during my care of the patient were reviewed by me and considered in my medical decision making (see chart for details).  Urinalysis is positive for leukocytes, nitrites, and blood.  On exam, patient with bilateral CVA tenderness, there is concern for possible pyelonephritis.  Patient has been afebrile, with no additional systemic symptoms at this time.  Will treat for acute cystitis with Bactrim DS 800/160 mg tablets twice daily for the next 7 days.  Urine culture is pending.  Supportive care recommendations were provided and discussed with the patient to include fluids, over-the-counter analgesics, developing a toileting schedule, and avoiding caffeine.  Discussed strict ER follow-up precautions with patient.  Patient advised to  follow-up with her PCP as scheduled.  Patient was in agreement with this plan of care and verbalizes understanding.  All questions were answered.  Patient stable for discharge.  Final Clinical Impressions(s) / UC Diagnoses   Final diagnoses:  Acute cystitis without hematuria     Discharge Instructions      -Your urinalysis shows that you do have a urinary tract infection.  A urine culture has been ordered to ensure you are being treated with the appropriate antibiotic.  If the medication needs to be changed, you will be contacted.  You will also have access to your results via MyChart. -Take medications as prescribed. -Increase  fluids.  Try to drink at least 8-10 8 ounce glasses of water while symptoms persist. -You may take Tylenol for pain, fever, or general discomfort. -Develop a toileting schedule that will allow you to urinate at least every 2 hours. -Avoid caffeine to include tea, soda, and coffee while symptoms persist.. -If sexually active, void at least 15 to 20 minutes after sexual intercourse. -Go to the emergency department immediately if you develop worsening urinary symptoms, fever, chills, worsening abdominal pain, or other concerns.  -Follow-up with your PCP as scheduled. -Follow-up as needed.     ED Prescriptions     Medication Sig Dispense Auth. Provider   sulfamethoxazole-trimethoprim (BACTRIM DS) 800-160 MG tablet Take 1 tablet by mouth 2 (two) times daily for 7 days. 14 tablet Leath-Warren, Belen Bowers, NP      PDMP not reviewed this encounter.   Hardy Lia, NP 09/03/23 [phone]

## 2023-09-03 NOTE — ED Notes (Signed)
 Pt reports taking Azo 45 mins before being seen in clinic.

## 2023-09-03 NOTE — Discharge Instructions (Addendum)
-  Your urinalysis shows that you do have a urinary tract infection.  A urine culture has been ordered to ensure you are being treated with the appropriate antibiotic.  If the medication needs to be changed, you will be contacted.  You will also have access to your results via MyChart. -Take medications as prescribed. -Increase fluids.  Try to drink at least 8-10 8 ounce glasses of water while symptoms persist. -You may take Tylenol for pain, fever, or general discomfort. -Develop a toileting schedule that will allow you to urinate at least every 2 hours. -Avoid caffeine to include tea, soda, and coffee while symptoms persist.. -If sexually active, void at least 15 to 20 minutes after sexual intercourse. -Go to the emergency department immediately if you develop worsening urinary symptoms, fever, chills, worsening abdominal pain, or other concerns.  -Follow-up with your PCP as scheduled. -Follow-up as needed.

## 2023-09-04 ENCOUNTER — Encounter: Attending: Nurse Practitioner | Admitting: *Deleted

## 2023-09-04 VITALS — BP 150/74 | HR 74 | Temp 98.2°F | Resp 18

## 2023-09-04 DIAGNOSIS — D509 Iron deficiency anemia, unspecified: Secondary | ICD-10-CM | POA: Diagnosis not present

## 2023-09-04 MED ORDER — ACETAMINOPHEN 325 MG PO TABS
650.0000 mg | ORAL_TABLET | Freq: Once | ORAL | Status: AC
  Administered 2023-09-04: 650 mg via ORAL

## 2023-09-04 MED ORDER — IRON SUCROSE 300 MG IVPB - SIMPLE MED
300.0000 mg | Freq: Once | Status: DC
Start: 2023-09-04 — End: 2023-09-04
  Filled 2023-09-04: qty 265

## 2023-09-04 MED ORDER — DIPHENHYDRAMINE HCL 25 MG PO CAPS
25.0000 mg | ORAL_CAPSULE | Freq: Once | ORAL | Status: AC
  Administered 2023-09-04: 25 mg via ORAL

## 2023-09-04 MED ORDER — SODIUM CHLORIDE 0.9 % IV SOLN
300.0000 mg | Freq: Once | INTRAVENOUS | Status: AC
  Administered 2023-09-04: 300 mg via INTRAVENOUS
  Filled 2023-09-04: qty 15

## 2023-09-04 NOTE — Progress Notes (Addendum)
 Diagnosis: Iron Deficiency Anemia  Provider:  Harriet Limber, FNP   Procedure: IV Infusion  IV Type: Peripheral, IV Location: L Antecubital  Venofer (Iron Sucrose), Dose: 300 mg  Infusion Start Time: 1325  Infusion Stop Time: 1516  Post Infusion IV Care: Observation period completed and Peripheral IV Discontinued  Discharge: Condition: Good, Destination: Home . AVS Provided  Performed by:  Barbee Mamula Ragsdale, RN

## 2023-09-05 LAB — URINE CULTURE: Culture: >=100000 — AB

## 2023-09-11 ENCOUNTER — Encounter (INDEPENDENT_AMBULATORY_CARE_PROVIDER_SITE_OTHER): Admitting: Emergency Medicine

## 2023-09-11 ENCOUNTER — Ambulatory Visit (HOSPITAL_COMMUNITY)

## 2023-09-11 VITALS — BP 139/65 | HR 73 | Temp 97.7°F | Resp 18

## 2023-09-11 DIAGNOSIS — D509 Iron deficiency anemia, unspecified: Secondary | ICD-10-CM | POA: Diagnosis not present

## 2023-09-11 MED ORDER — IRON SUCROSE 20 MG/ML IV SOLN
300.0000 mg | Freq: Once | INTRAVENOUS | Status: AC
  Administered 2023-09-11: 300 mg via INTRAVENOUS
  Filled 2023-09-11: qty 15

## 2023-09-11 MED ORDER — ACETAMINOPHEN 325 MG PO TABS
650.0000 mg | ORAL_TABLET | Freq: Once | ORAL | Status: AC
  Administered 2023-09-11: 650 mg via ORAL

## 2023-09-11 MED ORDER — DIPHENHYDRAMINE HCL 25 MG PO CAPS
25.0000 mg | ORAL_CAPSULE | Freq: Once | ORAL | Status: AC
  Administered 2023-09-11: 25 mg via ORAL

## 2023-09-11 NOTE — Progress Notes (Signed)
 Diagnosis: Iron  Deficiency Anemia  Provider:   Harriet Limber FNP  Procedure: IV Infusion  IV Type: Peripheral, IV Location: L Antecubital  Venofer  (Iron  Sucrose), Dose: 300 mg  Infusion Start Time: 0900  Infusion Stop Time: 1044  Post Infusion IV Care: Observation period completed and Peripheral IV Discontinued  Discharge: Condition: Good, Destination: Home . AVS Provided  Performed by:  Arlina Benjamin, RN

## 2023-09-12 ENCOUNTER — Encounter: Payer: Self-pay | Admitting: Internal Medicine

## 2023-09-12 ENCOUNTER — Ambulatory Visit: Payer: 59 | Attending: Internal Medicine | Admitting: Internal Medicine

## 2023-09-12 VITALS — BP 142/82 | HR 76 | Ht 63.0 in | Wt 178.0 lb

## 2023-09-12 DIAGNOSIS — I10 Essential (primary) hypertension: Secondary | ICD-10-CM

## 2023-09-12 DIAGNOSIS — R010 Benign and innocent cardiac murmurs: Secondary | ICD-10-CM | POA: Insufficient documentation

## 2023-09-12 DIAGNOSIS — R030 Elevated blood-pressure reading, without diagnosis of hypertension: Secondary | ICD-10-CM | POA: Diagnosis not present

## 2023-09-12 NOTE — Progress Notes (Signed)
 Cardiology Office Note  Date: 09/12/2023   ID: Erin Vaughn, DOB 01-15-1971, MRN [id number]  PCP:  Omie Bickers, MD  Cardiologist:  Lasalle Pointer, MD Electrophysiologist:  None   History of Present Illness: Erin Vaughn is a 53 y.o. female with no PMH was referred to cardiology clinic for evaluation of systolic heart murmur.  No symptoms, no angina, DOE, dizziness, syncope, palpitations or leg swelling.  Her bilateral father passed away with massive heart attack between 35 years and 23 years of age.  No prior ischemia evaluation.  No prior history of MI/PCI/CABG.  She was on antihypertensive medications in the past but since 2022, she is not on any medications anymore.  Blood pressures at home controlled, 90-110 mm Hg SBP and today in the clinic, it is 140s millimeters mercury SBP.  Past Medical History:  Diagnosis Date   Achilles tendon rupture    2014   Anxiety    Asthma    adult onset no problems   Complication of anesthesia 2001   woke up during gallbladder surgery   Depression    GERD (gastroesophageal reflux disease)    History of MRSA infection 2010   right side Abdomen   Hypertension    Migraine    hx   PONV (postoperative nausea and vomiting)    prior surger 14 none during gallbladder 2000    Past Surgical History:  Procedure Laterality Date   ACHILLES TENDON SURGERY Right 05/10/2013   Procedure: RIGHT ACHILLES TENDON REPAIR;  Surgeon: Loel Ring, MD;  Location: [facility];  Service: Orthopedics;  Laterality: Right;  achilles tendon   CHOLECYSTECTOMY  2001    Current Outpatient Medications  Medication Sig Dispense Refill   ALPRAZolam (XANAX) 0.5 MG tablet Take 0.5 mg by mouth 2 (two) times daily as needed.     BIOTIN 5000 PO Take 1 tablet by mouth daily.     buPROPion  (WELLBUTRIN  XL) 300 MG 24 hr tablet Take 300 mg by mouth daily.     Calcium Carbonate (CALCIUM 500 PO) Take 1 tablet by mouth. 1 tablet per week     calcium carbonate (OS-CAL)  1250 (500 Ca) MG chewable tablet Chew by mouth.     cariprazine (VRAYLAR) 1.5 MG capsule Take 3 mg by mouth daily.     Cholecalciferol (VITAMIN D3) 25 MCG (1000 UT) CHEW Chew 2,000 Units by mouth in the morning and at bedtime.     COLLAGEN PO Take 50 mg by mouth 2 (two) times daily.     Cyanocobalamin  (VITAMIN B 12 PO) Take 1,000 mg by mouth 2 (two) times daily.     fluticasone (FLONASE) 50 MCG/ACT nasal spray Place 1 spray into both nostrils daily as needed for allergies.  4   gabapentin  (NEURONTIN ) 600 MG tablet Take 600 mg by mouth 2 (two) times daily.   1   montelukast  (SINGULAIR ) 10 MG tablet Take by mouth.     Multiple Vitamin (MULTIVITAMIN) tablet Take 2 tablets by mouth daily. "Womens "     Omega-3 Fatty Acids (OMEGA-3 FISH OIL) 1200 MG CAPS Take 1 capsule by mouth daily.     pantoprazole  (PROTONIX ) 40 MG tablet pantoprazole  40 mg tablet,delayed release  Take 1 tablet twice a day by oral route.     Theanine 200 MG CAPS Take 200 mg by mouth 2 (two) times daily.     vitamin C (ASCORBIC ACID) 250 MG tablet Take 250 mg by mouth 2 (two) times daily.  Current Facility-Administered Medications  Medication Dose Route Frequency Provider Last Rate Last Admin   acetaminophen  (TYLENOL ) tablet 650 mg  650 mg Oral Once Harriet Limber, FNP       diphenhydrAMINE  (BENADRYL ) capsule 25 mg  25 mg Oral Once Harriet Limber, FNP       Facility-Administered Medications Ordered in Other Visits  Medication Dose Route Frequency Provider Last Rate Last Admin   iron  sucrose (VENOFER ) 300 mg in sodium chloride  0.9 % 250 mL IVPB  300 mg Intravenous Once Harriet Limber, FNP       Allergies:  Penicillins, Zithromax [azithromycin], Nsaids, Vancomycin , and Adhesive [tape]   Social History: The patient  reports that she has been smoking cigarettes. She has a 7.5 pack-year smoking history. She has never used smokeless tobacco. She reports that she does not drink alcohol and does not use drugs.   Family  History: The patient's family history includes Asthma in an other family member; COPD in an other family member; Cancer in her mother; Hypertension in an other family member.   ROS:  Please see the history of present illness. Otherwise, complete review of systems is positive for none.  All other systems are reviewed and negative.   Physical Exam: VS:  BP (!) 142/82 (BP Location: Left Arm, Patient Position: Sitting, Cuff Size: Large)   Pulse 76   Ht 5\' 3"  (1.6 m)   Wt 178 lb (80.7 kg)   SpO2 97%   BMI 31.53 kg/m , BMI Body mass index is 31.53 kg/m.  Wt Readings from Last 3 Encounters:  09/12/23 178 lb (80.7 kg)  01/05/23 166 lb 4.8 oz (75.4 kg)  10/24/21 180 lb (81.6 kg)    General: Patient appears comfortable at rest. HEENT: Conjunctiva and lids normal, oropharynx clear with moist mucosa. Neck: Supple, no elevated JVP or carotid bruits, no thyromegaly. Lungs: Clear to auscultation, nonlabored breathing at rest. Cardiac: Regular rate and rhythm, no S3 or significant systolic murmur, no pericardial rub. Abdomen: Soft, nontender, no hepatomegaly, bowel sounds present, no guarding or rebound. Extremities: No pitting edema, distal pulses 2+. Skin: Warm and dry. Musculoskeletal: No kyphosis. Neuropsychiatric: Alert and oriented x3, affect grossly appropriate.  Recent Labwork: No results found for requested labs within last 365 days.  No results found for: "CHOL", "TRIG", "HDL", "CHOLHDL", "VLDL", "LDLCALC", "LDLDIRECT"   Assessment and Plan:  Benign heart murmur: No evidence of >Grade 2/6 systolic murmur or diastolic murmur noted on physical examination today.  No indication of echocardiogram.  She might have innocent heart murmur, benign.  Reassurance provided.  Whitecoat HTN: Home BP well controlled, 90-110 mm Hg SBP and today in the clinic, it is 140s mm Hg SBP.    Medication Adjustments/Labs and Tests Ordered: Current medicines are reviewed at length with the patient today.   Concerns regarding medicines are outlined above.    Disposition:  Follow up as needed  Signed, Rithvik Orcutt Beauford Bounds, MD, 09/12/2023 9:43 AM    [facility] at [facility] [address] [place], [place] [address]

## 2023-09-12 NOTE — Patient Instructions (Signed)
 Medication Instructions:  Your physician recommends that you continue on your current medications as directed. Please refer to the Current Medication list given to you today.  *If you need a refill on your cardiac medications before your next appointment, please call your pharmacy*  Lab Work: None If you have labs (blood work) drawn today and your tests are completely normal, you will receive your results only by: MyChart Message (if you have MyChart) OR A paper copy in the mail If you have any lab test that is abnormal or we need to change your treatment, we will call you to review the results.  Testing/Procedures: None  Follow-Up: At [facility], you and your health needs are our priority.  As part of our continuing mission to provide you with exceptional heart care, our providers are all part of one team.  This team includes your primary Cardiologist (physician) and Advanced Practice Providers or APPs (Physician Assistants and Nurse Practitioners) who all work together to provide you with the care you need, when you need it.  Your next appointment:    Follow up as needed   Provider:   You may see Luane School, MD or one of the following Advanced Practice Providers on your designated Care Team:   [place], PA-C  [place], [place] Jacolyn Reedy, [place]     We recommend signing up for the patient portal called "MyChart".  Sign up information is provided on this After Visit Summary.  MyChart is used to connect with patients for Virtual Visits (Telemedicine).  Patients are able to view lab/test results, encounter notes, upcoming appointments, etc.  Non-urgent messages can be sent to your provider as well.   To learn more about what you can do with MyChart, go to [email or web address].   Other Instructions

## 2023-11-23 ENCOUNTER — Other Ambulatory Visit: Payer: Self-pay

## 2023-11-23 ENCOUNTER — Emergency Department (HOSPITAL_COMMUNITY)

## 2023-11-23 ENCOUNTER — Emergency Department (HOSPITAL_COMMUNITY)
Admission: EM | Admit: 2023-11-23 | Discharge: 2023-11-23 | Disposition: A | Discharge status: Home / Self Care | Attending: Emergency Medicine | Admitting: Emergency Medicine

## 2023-11-23 ENCOUNTER — Encounter (HOSPITAL_COMMUNITY): Payer: Self-pay | Admitting: *Deleted

## 2023-11-23 DIAGNOSIS — J45909 Unspecified asthma, uncomplicated: Secondary | ICD-10-CM | POA: Diagnosis not present

## 2023-11-23 DIAGNOSIS — L03113 Cellulitis of right upper limb: Secondary | ICD-10-CM | POA: Insufficient documentation

## 2023-11-23 DIAGNOSIS — Z7951 Long term (current) use of inhaled steroids: Secondary | ICD-10-CM | POA: Insufficient documentation

## 2023-11-23 DIAGNOSIS — M79641 Pain in right hand: Secondary | ICD-10-CM | POA: Diagnosis present

## 2023-11-23 DIAGNOSIS — I10 Essential (primary) hypertension: Secondary | ICD-10-CM | POA: Diagnosis not present

## 2023-11-23 DIAGNOSIS — Z79899 Other long term (current) drug therapy: Secondary | ICD-10-CM | POA: Diagnosis not present

## 2023-11-23 DIAGNOSIS — F17210 Nicotine dependence, cigarettes, uncomplicated: Secondary | ICD-10-CM | POA: Diagnosis not present

## 2023-11-23 MED ORDER — ACETAMINOPHEN 500 MG PO TABS
1000.0000 mg | ORAL_TABLET | Freq: Once | ORAL | Status: AC
  Administered 2023-11-23: 1000 mg via ORAL
  Filled 2023-11-23: qty 2

## 2023-11-23 MED ORDER — CLINDAMYCIN HCL 300 MG PO CAPS: 300.0000 mg | ORAL_CAPSULE | Freq: Three times a day (TID) | ORAL | 0 refills | Status: AC

## 2023-11-23 NOTE — ED Triage Notes (Signed)
 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  Swelling to hand x 3-4 days.

## 2023-11-23 NOTE — ED Provider Notes (Signed)
 [facility] EMERGENCY DEPARTMENT AT [facility] Provider Note  CSN: [id number] Arrival date & time: 11/23/23 1506  Chief Complaint(s) Hand Problem  HPI Erin Vaughn is a 53 y.o. female history of hypertension presenting to the emergency department with right hand pain.  Patient reports pain in the right hand, primarily over the right middle finger, on the dorsal aspect.  Began 3 to 4 days ago.  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  No fevers or chills.  Not taking anything for pain.  No numbness or tingling.   Past Medical History Past Medical History:  Diagnosis Date   Achilles tendon rupture    2014   Anxiety    Asthma    adult onset no problems   Complication of anesthesia 2001   woke up during gallbladder surgery   Depression    GERD (gastroesophageal reflux disease)    History of MRSA infection 2010   right side Abdomen   Hypertension    Migraine    hx   PONV (postoperative nausea and vomiting)    prior surger 14 none during gallbladder 2000   Patient Active Problem List   Diagnosis Date Noted   White coat syndrome without diagnosis of hypertension 09/12/2023   Benign cardiac murmur 09/12/2023   Hidradenitis suppurativa 11/06/2020   Ingrowing toenail 11/06/2020   Iron  deficiency anemia 11/06/2020   Abnormal liver function tests 11/05/2020   Chronic low back pain 11/05/2020   Generalized anxiety disorder 11/05/2020   Seasonal allergic rhinitis 11/05/2020   Essential hypertension 11/01/2020   Gastroesophageal reflux disease without esophagitis 11/01/2020   Postoperative nausea and vomiting 07/19/2020   Former smoker 07/04/2020   Hypertriglyceridemia 01/27/2020   Nicotine dependence 01/27/2020   Prediabetes 01/27/2020   Vitamin D deficiency 01/27/2020   OSA (obstructive sleep apnea) 01/20/2020   Morbid obesity due to excess calories (HCC) 11/30/2019   S/P lumbar spinal  fusion 02/20/2017   Rupture of right Achilles tendon 05/10/2013   Achilles tendon tear 05/10/2013   Home Medication(s) Prior to Admission medications   Medication Sig Start Date End Date Taking? Authorizing Provider  clindamycin  (CLEOCIN ) 300 MG capsule Take 1 capsule (300 mg total) by mouth 3 (three) times daily for 7 days. 11/23/23 11/30/23 Yes Francesca Elsie LITTIE, MD  ALPRAZolam (XANAX) 0.5 MG tablet Take 0.5 mg by mouth 2 (two) times daily as needed. 10/19/20   [provider]  BIOTIN 5000 PO Take 1 tablet by mouth daily.    [provider]  buPROPion  (WELLBUTRIN  XL) 300 MG 24 hr tablet Take 300 mg by mouth daily. 08/07/21   [provider]  Calcium Carbonate (CALCIUM 500 PO) Take 1 tablet by mouth. 1 tablet per week    [provider]  calcium carbonate (OS-CAL) 1250 (500 Ca) MG chewable tablet Chew by mouth.    [provider]  cariprazine (VRAYLAR) 1.5 MG capsule Take 3 mg by mouth daily.    [provider]  Cholecalciferol (VITAMIN D3) 25 MCG (1000 UT) CHEW Chew 2,000 Units by mouth in the morning and at bedtime.    [provider]  COLLAGEN PO Take 50 mg by mouth 2 (two) times daily.    [provider]  Cyanocobalamin  (VITAMIN B 12 PO) Take 1,000 mg by mouth 2 (two) times daily.    [provider]  fluticasone (FLONASE) 50 MCG/ACT nasal spray Place 1 spray into  both nostrils daily as needed for allergies. 11/16/16   [provider]  gabapentin  (NEURONTIN ) 600 MG tablet Take 600 mg by mouth 2 (two) times daily.  11/25/16   [provider]  montelukast  (SINGULAIR ) 10 MG tablet Take by mouth. 11/17/19   [provider]  Multiple Vitamin (MULTIVITAMIN) tablet Take 2 tablets by mouth daily. Womens     [provider]  Omega-3 Fatty Acids (OMEGA-3 FISH OIL) 1200 MG CAPS Take 1 capsule by mouth daily.    [provider]  pantoprazole  (PROTONIX ) 40 MG tablet pantoprazole  40 mg  tablet,delayed release  Take 1 tablet twice a day by oral route. 10/07/19   [provider]  Theanine 200 MG CAPS Take 200 mg by mouth 2 (two) times daily.    [provider]  vitamin C (ASCORBIC ACID) 250 MG tablet Take 250 mg by mouth 2 (two) times daily.    [provider]                                                                                                                                    Past Surgical History Past Surgical History:  Procedure Laterality Date   ACHILLES TENDON SURGERY Right 05/10/2013   Procedure: RIGHT ACHILLES TENDON REPAIR;  Surgeon: Reyes JAYSON Billing, MD;  Location: [facility];  Service: Orthopedics;  Laterality: Right;  achilles tendon   CHOLECYSTECTOMY  2001   Family History Family History  Problem Relation Age of Onset   Cancer Mother    COPD Other    Hypertension Other    Asthma Other     Social History Social History   Tobacco Use   Smoking status: Every Day    Current packs/day: 0.50    Average packs/day: 0.5 packs/day for 15.0 years (7.5 ttl pk-yrs)    Types: Cigarettes   Smokeless tobacco: Never   Tobacco comments:    PCP is working with patient  Vaping Use   Vaping status: Former  Substance Use Topics   Alcohol use: No   Drug use: No   Allergies Penicillins, Zithromax [azithromycin], Nsaids, Vancomycin , and Adhesive [tape]  Review of Systems Review of Systems  All other systems reviewed and are negative.   Physical Exam Vital Signs  I have reviewed the triage vital signs BP (!) 170/85 (BP Location: Left Arm)   Pulse 74   Temp 98.1 F (36.7 C) (Oral)   Resp 18   Ht 5' 3 (1.6 m)   Wt 77.1 kg   SpO2 100%   BMI 30.11 kg/m  Physical Exam Vitals and nursing note reviewed.  Constitutional:      Appearance: Normal appearance.  HENT:     Head: Normocephalic and atraumatic.     Mouth/Throat:     Mouth: Mucous membranes are moist.  Eyes:     Conjunctiva/sclera: Conjunctivae normal.   Cardiovascular:     Rate and Rhythm: Normal rate.  Pulmonary:     Effort: Pulmonary effort is normal. No respiratory distress.  Abdominal:     General: Abdomen is flat.  Musculoskeletal:        General: No deformity.     Comments: Mild swelling to the dorsal right hand, mild tenderness to the right middle finger, mild warmth.  Tendon function intact but painful range of motion.  No fusiform digit swelling, pain with passive extension, tenderness over flexor sheath.  Few scattered superficial abrasions over middle finger  Skin:    General: Skin is warm and dry.     Capillary Refill: Capillary refill takes less than 2 seconds.  Neurological:     General: No focal deficit present.     Mental Status: She is alert. Mental status is at baseline.  Psychiatric:        Mood and Affect: Mood normal.        Behavior: Behavior normal.     ED Results and Treatments Labs (all labs ordered are listed, but only abnormal results are displayed) Labs Reviewed - No data to display                                                                                                                        Radiology DG Hand Complete Right Result Date: 11/23/2023 CLINICAL DATA:  Hand pain.  Cuts to RIGHT middle finger. EXAM: RIGHT HAND - COMPLETE 3+ VIEW COMPARISON:  None Available. FINDINGS: No evidence of fracture of the carpal or metacarpal bones. Radiocarpal joint is intact. Phalanges are normal. No soft tissue injury. IMPRESSION: No fracture or dislocation. Electronically Signed   By: Jackquline Boxer M.D.   On: 11/23/2023 16:13    Pertinent labs & imaging results that were available during my care of the patient were reviewed by me and considered in my medical decision making (see MDM for details).  Medications Ordered in ED Medications  acetaminophen  (TYLENOL ) tablet 1,000 mg (1,000 mg Oral Given 11/23/23 1550)                                                                                                                                      Procedures Procedures  (including critical care time)  Medical Decision Making / ED Course   MDM:  53 year old presenting to the emergency department with hand swelling.  Patient has some pain and tenderness over the right middle finger.  Does  not remember any specific injury other than scraping on the grill.  Does have some warmth and swelling in the area.  Differential includes cellulitis, traumatic injury such as fracture, arthritis.  Will obtain x-ray.  Likely discharge with antibiotics following this if no injury identified.  No evidence of flexor tenosynovitis or other dangerous condition.      Imaging Studies ordered: I ordered imaging studies including XR hand  On my interpretation imaging demonstrates no acute process I independently visualized and interpreted imaging. I agree with the radiologist interpretation   Medicines ordered and prescription drug management: Meds ordered this encounter  Medications   acetaminophen  (TYLENOL ) tablet 1,000 mg   clindamycin  (CLEOCIN ) 300 MG capsule    Sig: Take 1 capsule (300 mg total) by mouth 3 (three) times daily for 7 days.    Dispense:  21 capsule    Refill:  0    -I have reviewed the patients home medicines and have made adjustments as needed   Co morbidities that complicate the patient evaluation  Past Medical History:  Diagnosis Date   Achilles tendon rupture    2014   Anxiety    Asthma    adult onset no problems   Complication of anesthesia 2001   woke up during gallbladder surgery   Depression    GERD (gastroesophageal reflux disease)    History of MRSA infection 2010   right side Abdomen   Hypertension    Migraine    hx   PONV (postoperative nausea and vomiting)    prior surger 14 none during gallbladder 2000      Dispostion: Disposition decision including need for hospitalization was considered, and patient discharged from emergency department.    Final  Clinical Impression(s) / ED Diagnoses Final diagnoses:  Cellulitis of hand, right     This chart was dictated using voice recognition software.  Despite best efforts to proofread,  errors can occur which can change the documentation meaning.    Francesca Elsie CROME, MD 11/23/23 [phone]

## 2023-11-23 NOTE — ED Notes (Signed)
 Pt/family received d/c paperwork at this time. After going over the paperwork any questions, comments, or concerns were answered to the best of this nurse's knowledge. The pt/family verbally acknowledged the teachings/instructions.

## 2023-11-23 NOTE — Discharge Instructions (Addendum)
 We evaluated you for your hand pain and swelling.  Your x-ray did not show any dangerous findings like a broken bone or dislocation.  We suspect your swelling is due to a skin infection and have prescribed antibiotics.  If it does not improve, please follow-up with Dr. Agarwala, the hand surgeon.  Please also follow-up with your primary doctor for a recheck in about 1 week.  If you have worsening symptoms such as worsening swelling, increasing pain, fevers, swelling of your finger, please return to the emergency department for recheck.
